# Patient Record
Sex: Female | Born: 1995
Health system: Southern US, Community
[De-identification: ages and names within clinical notes are randomized; demographics above are authoritative.]

## PROBLEM LIST (undated history)

## (undated) ENCOUNTER — Ambulatory Visit: Admission: EM | Payer: Self-pay

## (undated) ENCOUNTER — Ambulatory Visit: Admission: EM | Payer: Self-pay | Source: Home / Self Care

## (undated) DIAGNOSIS — N644 Mastodynia: Secondary | ICD-10-CM

## (undated) DIAGNOSIS — T7840XA Allergy, unspecified, initial encounter: Secondary | ICD-10-CM

## (undated) DIAGNOSIS — R102 Pelvic and perineal pain unspecified side: Secondary | ICD-10-CM

## (undated) DIAGNOSIS — F329 Major depressive disorder, single episode, unspecified: Secondary | ICD-10-CM

## (undated) DIAGNOSIS — F32A Depression, unspecified: Secondary | ICD-10-CM

## (undated) DIAGNOSIS — K529 Noninfective gastroenteritis and colitis, unspecified: Secondary | ICD-10-CM

## (undated) DIAGNOSIS — F419 Anxiety disorder, unspecified: Secondary | ICD-10-CM

## (undated) DIAGNOSIS — N926 Irregular menstruation, unspecified: Secondary | ICD-10-CM

## (undated) HISTORY — DX: Irregular menstruation, unspecified: N92.6

## (undated) HISTORY — DX: Mastodynia: N64.4

## (undated) HISTORY — DX: Allergy, unspecified, initial encounter: T78.40XA

## (undated) HISTORY — DX: Anxiety disorder, unspecified: F41.9

## (undated) HISTORY — DX: Major depressive disorder, single episode, unspecified: F32.9

## (undated) HISTORY — DX: Noninfective gastroenteritis and colitis, unspecified: K52.9

## (undated) HISTORY — DX: Depression, unspecified: F32.A

## (undated) HISTORY — DX: Pelvic and perineal pain: R10.2

## (undated) HISTORY — DX: Pelvic and perineal pain unspecified side: R10.20

---
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
## 2018-01-11 DIAGNOSIS — R1032 Left lower quadrant pain: Secondary | ICD-10-CM | POA: Diagnosis not present

## 2018-01-11 DIAGNOSIS — N390 Urinary tract infection, site not specified: Secondary | ICD-10-CM | POA: Diagnosis not present

## 2018-01-21 ENCOUNTER — Other Ambulatory Visit: Payer: Self-pay

## 2018-01-21 ENCOUNTER — Emergency Department
Admission: EM | Admit: 2018-01-21 | Discharge: 2018-01-21 | Disposition: A | Discharge status: Home / Self Care | Payer: BLUE CROSS/BLUE SHIELD | Attending: Emergency Medicine | Admitting: Emergency Medicine

## 2018-01-21 ENCOUNTER — Encounter: Payer: Self-pay | Admitting: Emergency Medicine

## 2018-01-21 DIAGNOSIS — R52 Pain, unspecified: Secondary | ICD-10-CM

## 2018-01-21 DIAGNOSIS — N644 Mastodynia: Secondary | ICD-10-CM

## 2018-01-21 LAB — POCT PREGNANCY, URINE: PREG TEST UR: NEGATIVE

## 2018-01-21 NOTE — ED Triage Notes (Signed)
Pt to ED via POV c/o sharp pain in right breast over the past few months. Pt states that the past week she has felt like the pain has started to radiate into her right arm, her back, and her left chest. Pt denies shortness of breath, N/V. Pt states that the pain feels like a burning sensation, pt states that pressing on the nipple causes the burning sensation. Pt is in NAD at this time.

## 2018-01-21 NOTE — ED Notes (Signed)
ED Provider at bedside. 

## 2018-01-21 NOTE — ED Provider Notes (Signed)
[facility] Emergency Department Provider Note  Time seen: 7:01 PM  I have reviewed the triage vital signs and the nursing notes.   HISTORY  Chief Complaint Breast Pain    HPI Conchetta Lamia is a 22 y.o. female with no past medical history presents to the emergency department for right breast pain.  According to the patient for the past several weeks she has been expensing intermittent pain in the right breast.  She says for the past 1 week the pain is been much more constant.  States it is more burning in the right breast especially if she presses on the nipple.  Denies any pain with deep inspiration.  Denies any pain deeper in the chest.  Denies any leg pain or swelling.  Denies any cough.  Last period was approximately 2 weeks ago.  Has never been pregnant.   History reviewed. No pertinent past medical history.  There are no active problems to display for this patient.   History reviewed. No pertinent surgical history.  Prior to Admission medications   Not on File    Allergies  Allergen Reactions  . Amoxicillin   . Bactrim [Sulfamethoxazole-Trimethoprim]   . Cephalosporins   . Penicillins     No family history on file.  Social History Social History   Tobacco Use  . Smoking status: Never Smoker  . Smokeless tobacco: Never Used  Substance Use Topics  . Alcohol use: Not Currently  . Drug use: Not Currently    Review of Systems Constitutional: Negative for fever. Eyes: Negative for visual complaints ENT: Negative for recent illness/congestion Cardiovascular: Right breast pain.  Denies any pain deeper in the chest. Respiratory: Negative for shortness of breath.  Negative for cough. Gastrointestinal: Negative for abdominal pain, vomiting  Genitourinary: Negative for dysuria but states she was recently treated for urinary tract infection. Musculoskeletal: Negative for musculoskeletal complaints Skin: Negative for skin complaints   Neurological: Negative for headache All other ROS negative  ____________________________________________   PHYSICAL EXAM:  VITAL SIGNS: ED Triage Vitals [01/21/18 1709]  Enc Vitals Group     BP (!) 148/83     Pulse Rate 86     Resp 16     Temp 98.2 F (36.8 C)     Temp Source Oral     SpO2 97 %     Weight 164 lb (74.4 kg)     Height 5\' 4"  (1.626 m)     Head Circumference      Peak Flow      Pain Score 8     Pain Loc      Pain Edu?      Excl. in [place]?    Constitutional: Alert and oriented. Well appearing and in no distress. Eyes: Normal exam ENT   Head: Normocephalic and atraumatic.   Mouth/Throat: Mucous membranes are moist. Cardiovascular: Normal rate, regular rhythm. No murmur.   Respiratory: Normal respiratory effort without tachypnea nor retractions. Breath sounds are clear.   Gastrointestinal: Soft and nontender. No distention. Musculoskeletal: Nontender with normal range of motion in all extremities.  Mild right breast tenderness to palpation especially in the left lower quadrant of the breast.  No mass.  Normal axilla without mass or lymphadenopathy. Neurologic:  Normal speech and language. No gross focal neurologic deficits Skin:  Skin is warm, dry and intact.  Psychiatric: Mood and affect are normal.    EKG Reviewed and interpreted by myself shows normal sinus rhythm 83 bpm with a narrow  QRS, normal axis, normal intervals, no ST changes.  Normal EKG.  ____________________________________________   INITIAL IMPRESSION / ASSESSMENT AND PLAN / ED COURSE  Pertinent labs & imaging results that were available during my care of the patient were reviewed by me and considered in my medical decision making (see chart for details).  Patient presents emergency department for right breast pain constant for the past 1 week, worse if she presses on the nipple.  Describes the pain is more as a sharp shooting type pain anytime she touches her right breast.  Denies  any lumps or bumps.  On exam patient did have tenderness to palpation especially in the left lower quadrant of the right breast, no masses identified in the axilla or breast.  No discharge identified.  Examination performed with the nurse in the room.  Pregnancy test is negative.  Attempted to obtain a breast ultrasound however after speaking to radiology they prefer to have the patient follow-up in the breast clinic.  I believe this is appropriate as this does not appear to be an emergent test.  Patient states she has insurance and will follow-up in the breast center.  ____________________________________________   FINAL CLINICAL IMPRESSION(S) / ED DIAGNOSES  Right breast pain    Harvest Dark, MD 01/21/18 1940

## 2018-01-21 NOTE — Discharge Instructions (Signed)
Please call the number provided below to arrange a follow-up appointment with the breast clinic for further evaluation and possible ultrasound of your right breast.  Please call your primary care doctor on Tuesday to inform them of today's ER visit and to arrange a follow-up appointment.    [facility]  Address: [address] [place], [place], [place] [address] Phone: [phone]

## 2018-01-21 NOTE — ED Triage Notes (Signed)
First Nurse Note:  Arrives with c/o right breast pain intermittently x 2 months, worse over the past 10 days.  States pain radiates toward left breast.  No SOB/ DOE.  Skin warm and dry.  NAD

## 2018-02-01 DIAGNOSIS — R1030 Lower abdominal pain, unspecified: Secondary | ICD-10-CM | POA: Diagnosis not present

## 2018-02-02 DIAGNOSIS — R1030 Lower abdominal pain, unspecified: Secondary | ICD-10-CM | POA: Diagnosis not present

## 2018-02-11 DIAGNOSIS — R197 Diarrhea, unspecified: Secondary | ICD-10-CM | POA: Diagnosis not present

## 2018-02-11 DIAGNOSIS — R319 Hematuria, unspecified: Secondary | ICD-10-CM | POA: Diagnosis not present

## 2018-02-11 DIAGNOSIS — K529 Noninfective gastroenteritis and colitis, unspecified: Secondary | ICD-10-CM | POA: Diagnosis not present

## 2018-02-11 DIAGNOSIS — M549 Dorsalgia, unspecified: Secondary | ICD-10-CM | POA: Diagnosis not present

## 2018-02-11 DIAGNOSIS — R1032 Left lower quadrant pain: Secondary | ICD-10-CM | POA: Diagnosis not present

## 2018-02-15 ENCOUNTER — Encounter: Payer: Self-pay | Admitting: Obstetrics and Gynecology

## 2018-02-15 ENCOUNTER — Ambulatory Visit (INDEPENDENT_AMBULATORY_CARE_PROVIDER_SITE_OTHER): Payer: BLUE CROSS/BLUE SHIELD | Admitting: Obstetrics and Gynecology

## 2018-02-15 VITALS — BP 133/83 | HR 87 | Ht 64.0 in | Wt 163.7 lb

## 2018-02-15 DIAGNOSIS — N644 Mastodynia: Secondary | ICD-10-CM

## 2018-02-15 DIAGNOSIS — R102 Pelvic and perineal pain: Secondary | ICD-10-CM

## 2018-02-15 NOTE — Progress Notes (Signed)
HPI:      [name] is a 22 y.o. G0P0000 who LMP was Patient's last menstrual period was 02/03/2018 (exact date).  Subjective:   She presents today with complaint of a 44-month history of right breast pain.  She states that it is a sharp stabbing pain when it occurs.  Simply touching the nipple causes the pain occasionally.  She reports a breast heaviness/fullness that she is concerned about as well.  She has been seen in the emergency department for this pain. She also complains of intermittent left lower quadrant pain which has been present on and off for several months.  She is not having that pain today. Patient describes normal menstrual cycles approximately 10/year.  Her last 3 cycles have been regular and monthly.  She reports that the nipple pain and the pelvic pain have no relationship to her menstrual cycle. She uses condoms for birth control and does not have pain with intercourse. She reports there is no family history of breast cancer.  She denies nipple discharge.    Hx: The following portions of the patient's history were reviewed and updated as appropriate:             She  has a past medical history of Breast pain, Irregular menstrual cycle, and Pelvic pain. She does not have a problem list on file. She  has a past surgical history that includes No past surgeries. Her family history includes Diabetes in her father. She  reports that she has never smoked. She has never used smokeless tobacco. She reports that she drank alcohol. She reports that she has current or past drug history. She has a current medication list which includes the following prescription(s): hyoscyamine and metronidazole. She is allergic to amoxicillin; bactrim [sulfamethoxazole-trimethoprim]; cephalosporins; and penicillins.       Review of Systems:  Review of Systems  Constitutional: Denied constitutional symptoms, night sweats, recent illness, fatigue, fever, insomnia and weight loss.  Eyes:  Denied eye symptoms, eye pain, photophobia, vision change and visual disturbance.  Ears/Nose/Throat/Neck: Denied ear, nose, throat or neck symptoms, hearing loss, nasal discharge, sinus congestion and sore throat.  Cardiovascular: Denied cardiovascular symptoms, arrhythmia, chest pain/pressure, edema, exercise intolerance, orthopnea and palpitations.  Respiratory: Denied pulmonary symptoms, asthma, pleuritic pain, productive sputum, cough, dyspnea and wheezing.  Gastrointestinal: Denied, gastro-esophageal reflux, melena, nausea and vomiting.  Genitourinary: See HPI for additional information.  Musculoskeletal: Denied musculoskeletal symptoms, stiffness, swelling, muscle weakness and myalgia.  Dermatologic: Denied dermatology symptoms, rash and scar.  Neurologic: Denied neurology symptoms, dizziness, headache, neck pain and syncope.  Psychiatric: Denied psychiatric symptoms, anxiety and depression.  Endocrine: Denied endocrine symptoms including hot flashes and night sweats.   Meds:   Current Outpatient Medications on File Prior to Visit  Medication Sig Dispense Refill  . hyoscyamine (LEVSIN, ANASPAZ) 0.125 MG tablet Take 0.125 mg by mouth every 4 (four) hours as needed.    . metroNIDAZOLE (FLAGYL) 500 MG tablet TK 1 T PO TID  0   No current facility-administered medications on file prior to visit.     Objective:     Vitals:   02/15/18 1020  BP: 133/83  Pulse: 87              Physical examination General NAD, Conversant  Skin No rashes, lesions or ulceration. Normal palpated skin turgor. No nodularity.  Breasts: No masses or discharge.  Symmetric.  No axillary adenopathy.  Abdomen: Soft.  Non-tender.  No masses.  No HSM. No  hernia or pain in the left lower quadrant is in the area where the hernia would occur.  It is more inguinal than abdominal.  The pain could not be elicited today.       Assessment:    G0P0000 There are no active problems to display for this patient.     1. Nipple pain   2. Breast pain, right   3. Pelvic pain in female     No breast masses no evidence of any breast abnormality.  The pain seems to be superficial to touch.  Pelvic pain likely related to small hernia or muscular strain.  Location is not pelvic.   Plan:            1.  Ultrasound right breast to rule out abnormality  2.  Reassurance given regarding left lower quadrant pain.  If pain becomes worse consider pelvic ultrasound and exam.  3.  Prolactin because of her fullness feeling and breast symptoms. Orders Orders Placed This Encounter  Procedures  . MM Digital Diagnostic Bilat  . MM DIAG BREAST TOMO UNI RIGHT  . US BREAST LTD UNI LEFT INC AXILLA  . US BREAST LTD UNI RIGHT INC AXILLA    No orders of the defined types were placed in this encounter.     F/U  Return for We will contact her with any abnormal test results. I spent 31 minutes involved in the care of this patient of which greater than 50% was spent discussing her symptoms of breast pain and the work-up as well as possible diagnoses, her pelvic pain, the meaning of an inguinal hernia, menstrual cycle issues etc.  Finis Bud, M.D. 02/15/2018 11:01 AM

## 2018-02-16 DIAGNOSIS — K529 Noninfective gastroenteritis and colitis, unspecified: Secondary | ICD-10-CM | POA: Diagnosis not present

## 2018-02-16 DIAGNOSIS — R197 Diarrhea, unspecified: Secondary | ICD-10-CM | POA: Diagnosis not present

## 2018-02-16 DIAGNOSIS — R1032 Left lower quadrant pain: Secondary | ICD-10-CM | POA: Diagnosis not present

## 2018-02-16 DIAGNOSIS — M549 Dorsalgia, unspecified: Secondary | ICD-10-CM | POA: Diagnosis not present

## 2018-02-21 ENCOUNTER — Other Ambulatory Visit: Payer: Self-pay | Admitting: Obstetrics and Gynecology

## 2018-02-21 ENCOUNTER — Other Ambulatory Visit: Payer: BLUE CROSS/BLUE SHIELD

## 2018-02-21 ENCOUNTER — Ambulatory Visit
Admission: RE | Admit: 2018-02-21 | Discharge: 2018-02-21 | Disposition: A | Discharge status: Home / Self Care | Payer: BLUE CROSS/BLUE SHIELD | Source: Ambulatory Visit | Attending: Obstetrics and Gynecology | Admitting: Obstetrics and Gynecology

## 2018-02-21 DIAGNOSIS — N644 Mastodynia: Secondary | ICD-10-CM

## 2018-02-21 DIAGNOSIS — R928 Other abnormal and inconclusive findings on diagnostic imaging of breast: Secondary | ICD-10-CM

## 2018-02-21 DIAGNOSIS — R922 Inconclusive mammogram: Secondary | ICD-10-CM | POA: Diagnosis not present

## 2018-02-21 DIAGNOSIS — R921 Mammographic calcification found on diagnostic imaging of breast: Secondary | ICD-10-CM

## 2018-02-22 LAB — PROLACTIN: Prolactin: 23.6 ng/mL — ABNORMAL HIGH (ref 4.8–23.3)

## 2018-02-24 ENCOUNTER — Other Ambulatory Visit: Payer: Self-pay

## 2018-02-24 DIAGNOSIS — E229 Hyperfunction of pituitary gland, unspecified: Principal | ICD-10-CM

## 2018-02-24 DIAGNOSIS — R7989 Other specified abnormal findings of blood chemistry: Secondary | ICD-10-CM

## 2018-03-02 DIAGNOSIS — R1032 Left lower quadrant pain: Secondary | ICD-10-CM | POA: Diagnosis not present

## 2018-03-02 DIAGNOSIS — K529 Noninfective gastroenteritis and colitis, unspecified: Secondary | ICD-10-CM | POA: Diagnosis not present

## 2018-03-02 DIAGNOSIS — R197 Diarrhea, unspecified: Secondary | ICD-10-CM | POA: Diagnosis not present

## 2018-03-02 DIAGNOSIS — R933 Abnormal findings on diagnostic imaging of other parts of digestive tract: Secondary | ICD-10-CM | POA: Diagnosis not present

## 2018-03-02 DIAGNOSIS — K5730 Diverticulosis of large intestine without perforation or abscess without bleeding: Secondary | ICD-10-CM | POA: Diagnosis not present

## 2018-03-02 HISTORY — PX: COLONOSCOPY: SHX174

## 2018-03-02 LAB — HM COLONOSCOPY

## 2018-03-08 ENCOUNTER — Ambulatory Visit
Admission: RE | Admit: 2018-03-08 | Discharge: 2018-03-08 | Disposition: A | Discharge status: Home / Self Care | Payer: BLUE CROSS/BLUE SHIELD | Source: Ambulatory Visit | Attending: Obstetrics and Gynecology | Admitting: Obstetrics and Gynecology

## 2018-03-08 DIAGNOSIS — R921 Mammographic calcification found on diagnostic imaging of breast: Secondary | ICD-10-CM | POA: Diagnosis not present

## 2018-03-08 DIAGNOSIS — R928 Other abnormal and inconclusive findings on diagnostic imaging of breast: Secondary | ICD-10-CM

## 2018-03-08 DIAGNOSIS — D241 Benign neoplasm of right breast: Secondary | ICD-10-CM | POA: Diagnosis not present

## 2018-03-08 DIAGNOSIS — R920 Mammographic microcalcification found on diagnostic imaging of breast: Secondary | ICD-10-CM | POA: Diagnosis not present

## 2018-03-08 HISTORY — PX: BREAST BIOPSY: SHX20

## 2018-03-10 ENCOUNTER — Other Ambulatory Visit: Payer: BLUE CROSS/BLUE SHIELD

## 2018-03-10 DIAGNOSIS — E229 Hyperfunction of pituitary gland, unspecified: Secondary | ICD-10-CM | POA: Diagnosis not present

## 2018-03-10 DIAGNOSIS — R7989 Other specified abnormal findings of blood chemistry: Secondary | ICD-10-CM

## 2018-03-10 LAB — SURGICAL PATHOLOGY

## 2018-03-11 LAB — PROLACTIN: Prolactin: 11.2 ng/mL (ref 4.8–23.3)

## 2018-03-15 ENCOUNTER — Telehealth: Payer: Self-pay

## 2018-03-15 NOTE — Telephone Encounter (Signed)
Per TH- Pt's breast bx showed papilloma. Pt is aware. She has an appt with Dr. Terri Piedra on 04/12/2018 at 10:45.

## 2018-03-16 ENCOUNTER — Ambulatory Visit (INDEPENDENT_AMBULATORY_CARE_PROVIDER_SITE_OTHER): Payer: BLUE CROSS/BLUE SHIELD | Admitting: Family Medicine

## 2018-03-16 ENCOUNTER — Telehealth: Payer: Self-pay

## 2018-03-16 ENCOUNTER — Encounter: Payer: Self-pay | Admitting: Family Medicine

## 2018-03-16 VITALS — BP 116/82 | HR 69 | Temp 98.1°F | Resp 16 | Ht 64.0 in | Wt 165.0 lb

## 2018-03-16 DIAGNOSIS — F32A Depression, unspecified: Secondary | ICD-10-CM

## 2018-03-16 DIAGNOSIS — Z8719 Personal history of other diseases of the digestive system: Secondary | ICD-10-CM | POA: Insufficient documentation

## 2018-03-16 DIAGNOSIS — Z124 Encounter for screening for malignant neoplasm of cervix: Secondary | ICD-10-CM

## 2018-03-16 DIAGNOSIS — F329 Major depressive disorder, single episode, unspecified: Secondary | ICD-10-CM

## 2018-03-16 DIAGNOSIS — R319 Hematuria, unspecified: Secondary | ICD-10-CM | POA: Diagnosis not present

## 2018-03-16 DIAGNOSIS — N898 Other specified noninflammatory disorders of vagina: Secondary | ICD-10-CM | POA: Insufficient documentation

## 2018-03-16 DIAGNOSIS — F419 Anxiety disorder, unspecified: Secondary | ICD-10-CM | POA: Diagnosis not present

## 2018-03-16 DIAGNOSIS — F411 Generalized anxiety disorder: Secondary | ICD-10-CM | POA: Insufficient documentation

## 2018-03-16 DIAGNOSIS — D241 Benign neoplasm of right breast: Secondary | ICD-10-CM | POA: Insufficient documentation

## 2018-03-16 LAB — POCT URINALYSIS DIPSTICK
Bilirubin, UA: NEGATIVE
GLUCOSE UA: NEGATIVE
Ketones, UA: NEGATIVE
LEUKOCYTES UA: NEGATIVE
NITRITE UA: NEGATIVE
Odor: NORMAL
PROTEIN UA: NEGATIVE
SPEC GRAV UA: 1.025 (ref 1.010–1.025)
Urobilinogen, UA: 0.2 E.U./dL
pH, UA: 6 (ref 5.0–8.0)

## 2018-03-16 MED ORDER — SERTRALINE HCL 25 MG PO TABS: ORAL_TABLET | ORAL | 1 refills | Status: DC

## 2018-03-16 NOTE — Assessment & Plan Note (Signed)
Persistent hematuria on UA today Patient is asymptomatic We will send microscopy and culture Recommend referral to urology given that this is persisted for further evaluation and management

## 2018-03-16 NOTE — Patient Instructions (Addendum)
[email or web address]  Zoloft 25mg  pills: Start with 1/2 pill daily x2 weeks Then 1 pills daily for 2 weeks Then 2 pills daily for 2 weeks Then 3 pills daily for 2 weeks  Can go more slowly if side effects are bad    Major Depressive Disorder, Adult Major depressive disorder (MDD) is a mental health condition. MDD often makes you feel sad, hopeless, or helpless. MDD can also cause symptoms in your body. MDD can affect your:  Work.  School.  Relationships.  Other normal activities.  MDD can range from mild to very bad. It may occur once (single episode MDD). It can also occur many times (recurrent MDD). The main symptoms of MDD often include:  Feeling sad, depressed, or irritable most of the time.  Loss of interest.  MDD symptoms also include:  Sleeping too much or too little.  Eating too much or too little.  A change in your weight.  Feeling tired (fatigue) or having low energy.  Feeling worthless.  Feeling guilty.  Trouble making decisions.  Trouble thinking clearly.  Thoughts of suicide or harming others.  Feeling weak.  Feeling agitated.  Keeping yourself from being around other people (isolation).  Follow these instructions at home: Activity  Do these things as told by your doctor: ? Go back to your normal activities. ? Exercise regularly. ? Spend time outdoors. Alcohol  Talk with your doctor about how alcohol can affect your antidepressant medicines.  Do not drink alcohol. Or, limit how much alcohol you drink. ? This means no more than 1 drink a day for nonpregnant women and 2 drinks a day for men. One drink equals one of these:  12 oz of beer.  5 oz of wine.  1 oz of hard liquor. General instructions  Take over-the-counter and prescription medicines only as told by your doctor.  Eat a healthy diet.  Get plenty of sleep.  Find activities that you enjoy. Make time to do them.  Think about joining a support group. Your doctor may  be able to suggest a group for you.  Keep all follow-up visits as told by your doctor. This is important. Where to find more information:  [employer] on Mental Illness: ? www.nami.[place]: ? https://carter.com/  National Suicide Prevention Lifeline: ? (463)344-4890. This is free, 24-hour help. Contact a doctor if:  Your symptoms get worse.  You have new symptoms. Get help right away if:  You self-harm.  You see, hear, taste, smell, or feel things that are not present (hallucinate). If you ever feel like you may hurt yourself or others, or have thoughts about taking your own life, get help right away. You can go to your nearest emergency department or call:  Your local emergency services (911 in the [place]  A suicide crisis helpline, such as the National Suicide Prevention Lifeline: ? 684-703-2246. This is open 24 hours a day.  This information is not intended to replace advice given to you by your health care provider. Make sure you discuss any questions you have with your health care provider. Document Released: 07/29/2015 Document Revised: 05/03/2016 Document Reviewed: 05/03/2016 Elsevier Interactive Patient Education  2017 [employer].

## 2018-03-16 NOTE — Assessment & Plan Note (Signed)
Patient has follow-up with surgery next month Her breast does appear bruised today and the papilloma is palpable, but it does appear to be healing well Encouraged ongoing icing and Tylenol use As she is over a week out from her biopsy, it is okay to use NSAIDs intermittently as well

## 2018-03-16 NOTE — Assessment & Plan Note (Signed)
New problem ongoing for a few months Check BV and yeast, as well as trichomonas, GC, chlamydia on her Pap We will treat as indicated by the results

## 2018-03-16 NOTE — Assessment & Plan Note (Addendum)
Patient with long-standing history of depression and anxiety that is currently uncontrolled As she believes she is tried Celexa in the past, we will not try this again Did discuss different SSRIs Agreed to start Zoloft at low dose with slow taper up Discussed that it can take 6 to 8 weeks to reach full efficacy Discussed possible side effects including GI upset, sexual dysfunction, increased SI Contracted for safety as patient only has rare passive SI with no plan Advised on the synergistic effect of medications with therapy Patient to look into finding a therapist that is covered by her insurance Follow-up in about 2 months and consider further increase of Zoloft dose Return precautions discussed Check CMP, CBC, TSH for any secondary causes of depression anxiety

## 2018-03-16 NOTE — Telephone Encounter (Signed)
Left message advising pt. OK per DPR. 

## 2018-03-16 NOTE — Assessment & Plan Note (Signed)
Patient with a few months of left lower quadrant pain with radiation to her back that was treated as colitis after finding on CT scan Pain is now resolved Recent colonoscopy normal per report We will request records from GI Also wonder if this could have represented nephrolithiasis given her hematuria We will continue to monitor for recurrence of symptoms

## 2018-03-16 NOTE — Telephone Encounter (Signed)
-----   Message from Virginia Crews, MD sent at 03/16/2018  1:19 PM EDT ----- Urinalysis does show moderate amount of blood.  Otherwise normal.  I will send urine for culture to ensure there is no infection causing this, as well as microscopy to ensure that the blood is real and not due to dipstick error.  As she has had persistent blood in her urine, I recommend a referral to a urologist.  I have placed this referral.  Virginia Crews, MD, MPH [facility] 03/16/2018 1:19 PM

## 2018-03-16 NOTE — Progress Notes (Signed)
Patient: Katrina Bryant, Female    DOB: 05/18/1996, 22 y.o.   MRN: [id number] Visit Date: 03/16/2018  Today's Provider: Lavon Paganini, MD   I, Martha Clan, CMA, am acting as scribe for Lavon Paganini, MD.  Chief Complaint  Patient presents with  . New Patient (Initial Visit)   Subjective:    New Patient Katrina Bryant is a 22 y.o. female who presents today as a new pt to establish care. She feels fairly well. She reports exercising none, as of recent. She reports she is sleeping poorly.  She was previously seen at [facility] a few years ago.  She has been without a physician for a few years.  Pt is c/o anxiety and depression. She has had recent health scares, including a breast biopsy (Dr Theda Sers at [place]) which was performed on 03/08/2018 (this is still causing pain, and pt would like PCP to examine this). She recently started a new job, which could be contributing to the anxiety.  She states she has suffered with depression and anxiety for a long time.  She was diagnosed when she was in high school.  She was previously seeing a therapist which was going well.  She tried medications in the past as well.  She cannot remember exactly which medication she tried, may be Celexa, but it did cause nausea.  She has been off treatment for few years since she lost her health insurance.  She does report passive SI that occurs about once a month.  She has no plan.  She states that her family is a good support system.  She is interested in trying a medication and resuming therapy.  Her breast biopsy revealed a right breast papilloma.  She has an appointment with Dr. Tollie Pizza next month for follow-up of this.  Patient has had issues for a few months with left lower quadrant pain that also radiated to her back.  She was seen in several different urgent cares including the [facility].  She tried a few courses of antibiotics.  She then was seen to have colitis on a CT scan and  put on Cipro and Flagyl.  She saw GI in [place] and her pain was somewhat better but not gone.  She underwent colonoscopy on 03/02/2018 and was told that everything seemed normal and they thought it was an infection that had cleared with the antibiotics.  During this work-up for her colitis, it was noted on several UAs that she had blood in her urine.  She states that she was not evaluated for possible nephrolithiasis or other causes of hematuria.  She is worried that her pain will come back even though she has no pain currently.  She also reports vaginal itching and discharge for a few months.  The discharge is white and thick.  She is not currently sexually active but has been in the past.  She has never had a Pap smear. -----------------------------------------------------------------   Review of Systems  Constitutional: Positive for fatigue. Negative for activity change, appetite change, chills, diaphoresis, fever and unexpected weight change.  HENT: Negative.   Eyes: Negative.   Respiratory: Negative.   Cardiovascular: Negative.   Gastrointestinal: Negative.   Endocrine: Negative.   Genitourinary: Negative.   Musculoskeletal: Negative.   Skin: Negative.   Allergic/Immunologic: Negative.   Neurological: Negative.   Hematological: Negative.   Psychiatric/Behavioral: Positive for agitation. Negative for behavioral problems, confusion, decreased concentration, dysphoric mood, hallucinations, self-injury, sleep disturbance and suicidal ideas. The  patient is not nervous/anxious and is not hyperactive.     Social History      She  reports that she has never smoked. She has never used smokeless tobacco. She reports that she does not drink alcohol or use drugs.       Social History   Socioeconomic History  . Marital status: Single    Spouse name: Not on file  . Number of children: 0  . Years of education: 54  . Highest education level: Associate degree: academic program  Occupational  History    Employer: Herbalist  Social Needs  . Financial resource strain: Not on file  . Food insecurity:    Worry: Not on file    Inability: Not on file  . Transportation needs:    Medical: Not on file    Non-medical: Not on file  Tobacco Use  . Smoking status: Never Smoker  . Smokeless tobacco: Never Used  Substance and Sexual Activity  . Alcohol use: Never    Frequency: Never  . Drug use: Never  . Sexual activity: Not Currently    Partners: Male    Birth control/protection: Condom    Comment: last sexually active February 2019 (03/16/18)  Lifestyle  . Physical activity:    Days per week: Not on file    Minutes per session: Not on file  . Stress: Not on file  Relationships  . Social connections:    Talks on phone: Not on file    Gets together: Not on file    Attends religious service: Not on file    Active member of club or organization: Not on file    Attends meetings of clubs or organizations: Not on file    Relationship status: Not on file  Other Topics Concern  . Not on file  Social History Narrative  . Not on file    Past Medical History:  Diagnosis Date  . Allergy   . Anxiety   . Breast pain   . Colitis   . Depression   . Irregular menstrual cycle   . Pelvic pain      Patient Active Problem List   Diagnosis Date Noted  . Hematuria 03/16/2018  . Vaginal discharge 03/16/2018  . Anxiety and depression 03/16/2018  . History of colitis 03/16/2018  . Papilloma of right breast 03/16/2018    Past Surgical History:  Procedure Laterality Date  . BREAST BIOPSY Right 03/08/2018   Affirm Bx- path pending- X clip  . COLONOSCOPY  03/02/2018    Family History        Family Status  Relation Name Status  . Father  Alive  . Mother  Alive  . Sister  Alive  . Brother  Alive  . Neg Hx  (Not Specified)        Her family history includes Diabetes in her father; Healthy in her brother and sister; Hyperlipidemia in her father; Hypertension  in her father and mother. There is no history of Breast cancer, Ovarian cancer, Colon cancer, or Cervical cancer.      Allergies  Allergen Reactions  . Cephalosporins   . Amoxicillin Rash  . Bactrim [Sulfamethoxazole-Trimethoprim] Rash  . Biaxin [Clarithromycin] Rash  . Clindamycin/Lincomycin Rash  . Penicillins Rash     Current Outpatient Medications:  .  sertraline (ZOLOFT) 25 MG tablet, Take 1/2 tab PO daily x2wks, then 25mg  PO daily x2 wks, then 50mg  daily x2 wks, then 75mg  daily x2 wks, Disp: 90 tablet, Rfl:  1   Patient Care Team: Virginia Crews, MD as PCP - General (Family Medicine)      Objective:   Vitals: BP 116/82 (BP Location: Left Arm, Patient Position: Sitting, Cuff Size: Normal)   Pulse 69   Temp 98.1 F (36.7 C) (Oral)   Resp 16   Ht 5\' 4"  (1.626 m)   Wt 165 lb (74.8 kg)   LMP 03/04/2018   SpO2 96%   BMI 28.32 kg/m    Vitals:   03/16/18 0916  BP: 116/82  Pulse: 69  Resp: 16  Temp: 98.1 F (36.7 C)  TempSrc: Oral  SpO2: 96%  Weight: 165 lb (74.8 kg)  Height: 5\' 4"  (1.626 m)     Physical Exam  Constitutional: She is oriented to person, place, and time. She appears well-developed and well-nourished. No distress.  HENT:  Head: Normocephalic and atraumatic.  Right Ear: External ear normal.  Left Ear: External ear normal.  Nose: Nose normal.  Mouth/Throat: Oropharynx is clear and moist.  Eyes: Pupils are equal, round, and reactive to light. Conjunctivae and EOM are normal. No scleral icterus.  Neck: Neck supple. No thyromegaly present.  Cardiovascular: Normal rate, regular rhythm, normal heart sounds and intact distal pulses.  No murmur heard. Pulmonary/Chest: Effort normal and breath sounds normal. No respiratory distress. She has no wheezes. She has no rales.  Abdominal: Soft. Bowel sounds are normal. She exhibits no distension. There is no tenderness. There is no rebound and no guarding.  Genitourinary:  Genitourinary Comments: GYN:   External genitalia within normal limits.  Vaginal mucosa pink, moist, normal rugae.  Nonfriable cervix without lesions, + discharge, no bleeding noted on speculum exam.  No cervical motion tenderness. Breasts: right breast with papilloma palpable and bruising superficially, left breast normal without mass, skin or nipple changes or axillary nodes.   Musculoskeletal: She exhibits no edema or deformity.  Lymphadenopathy:    She has no cervical adenopathy.  Neurological: She is alert and oriented to person, place, and time.  Skin: Skin is warm and dry. Capillary refill takes less than 2 seconds. No rash noted.  Psychiatric: She has a normal mood and affect. Her behavior is normal.  Vitals reviewed.     Depression screen PHQ 2/9 03/16/2018  Decreased Interest 2  Down, Depressed, Hopeless 3  PHQ - 2 Score 5  Altered sleeping 2  Tired, decreased energy 3  Change in appetite 3  Feeling bad or failure about yourself  2  Trouble concentrating 1  Moving slowly or fidgety/restless 0  Suicidal thoughts 1  PHQ-9 Score 17  Difficult doing work/chores Very difficult    GAD 7 : Generalized Anxiety Score 03/16/2018  Nervous, Anxious, on Edge 3  Control/stop worrying 3  Worry too much - different things 3  Trouble relaxing 2  Restless 1  Easily annoyed or irritable 3  Afraid - awful might happen 3  Total GAD 7 Score 18  Anxiety Difficulty Extremely difficult      Results for orders placed or performed in visit on 03/16/18  POCT urinalysis dipstick  Result Value Ref Range   Color, UA dark yellow    Clarity, UA clear    Glucose, UA Negative Negative   Bilirubin, UA negative    Ketones, UA negative    Spec Grav, UA 1.025 1.010 - 1.025   Blood, UA moderate    pH, UA 6.0 5.0 - 8.0   Protein, UA Negative Negative   Urobilinogen, UA 0.2 0.2 or  1.0 E.U./dL   Nitrite, UA negative    Leukocytes, UA Negative Negative   Appearance dark    Odor normal      Assessment & Plan:    Problem  List Items Addressed This Visit      Other   Hematuria    Persistent hematuria on UA today Patient is asymptomatic We will send microscopy and culture Recommend referral to urology given that this is persisted for further evaluation and management      Relevant Orders   POCT urinalysis dipstick (Completed)   Urinalysis, microscopic only   Urine Culture   Ambulatory referral to Urology   Vaginal discharge    New problem ongoing for a few months Check BV and yeast, as well as trichomonas, GC, chlamydia on her Pap We will treat as indicated by the results      Relevant Orders   NuSwab BV and Candida, NAA   Anxiety and depression - Primary    Patient with long-standing history of depression and anxiety that is currently uncontrolled As she believes she is tried Celexa in the past, we will not try this again Did discuss different SSRIs Agreed to start Zoloft at low dose with slow taper up Discussed that it can take 6 to 8 weeks to reach full efficacy Discussed possible side effects including GI upset, sexual dysfunction, increased SI Contracted for safety as patient only has rare passive SI with no plan Advised on the synergistic effect of medications with therapy Patient to look into finding a therapist that is covered by her insurance Follow-up in about 2 months and consider further increase of Zoloft dose Return precautions discussed Check CMP, CBC, TSH for any secondary causes of depression anxiety      Relevant Medications   sertraline (ZOLOFT) 25 MG tablet   Other Relevant Orders   CBC   Comprehensive metabolic panel   TSH   History of colitis    Patient with a few months of left lower quadrant pain with radiation to her back that was treated as colitis after finding on CT scan Pain is now resolved Recent colonoscopy normal per report We will request records from GI Also wonder if this could have represented nephrolithiasis given her hematuria We will continue to  monitor for recurrence of symptoms      Relevant Orders   Comprehensive metabolic panel   Papilloma of right breast    Patient has follow-up with surgery next month Her breast does appear bruised today and the papilloma is palpable, but it does appear to be healing well Encouraged ongoing icing and Tylenol use As she is over a week out from her biopsy, it is okay to use NSAIDs intermittently as well       Other Visit Diagnoses    Cervical cancer screening       Relevant Orders   Pap IG, CT/NG w/ reflex HPV when ASC-U       Return in about 2 months (around 05/17/2018) for depression/anxiety f/u.  ROI sent to GI and previous PCP  Plan to update vaccinations at next visit  The entirety of the information documented in the History of Present Illness, Review of Systems and Physical Exam were personally obtained by me. Portions of this information were initially documented by Raquel Sarna Ratchford, CMA and reviewed by me for thoroughness and accuracy.    Virginia Crews, MD, MPH [facility] 03/16/2018 1:31 PM

## 2018-03-17 ENCOUNTER — Telehealth: Payer: Self-pay

## 2018-03-17 ENCOUNTER — Encounter: Payer: Self-pay | Admitting: General Surgery

## 2018-03-17 ENCOUNTER — Telehealth: Payer: Self-pay | Admitting: General Surgery

## 2018-03-17 DIAGNOSIS — R748 Abnormal levels of other serum enzymes: Secondary | ICD-10-CM

## 2018-03-17 LAB — COMPREHENSIVE METABOLIC PANEL
A/G RATIO: 2 (ref 1.2–2.2)
ALT: 80 IU/L — AB (ref 0–32)
AST: 43 IU/L — AB (ref 0–40)
Albumin: 4.9 g/dL (ref 3.5–5.5)
Alkaline Phosphatase: 77 IU/L (ref 39–117)
BUN/Creatinine Ratio: 18 (ref 9–23)
BUN: 9 mg/dL (ref 6–20)
Bilirubin Total: 0.6 mg/dL (ref 0.0–1.2)
CO2: 20 mmol/L (ref 20–29)
Calcium: 9.5 mg/dL (ref 8.7–10.2)
Chloride: 103 mmol/L (ref 96–106)
Creatinine, Ser: 0.51 mg/dL — ABNORMAL LOW (ref 0.57–1.00)
GFR calc Af Amer: 159 mL/min/{1.73_m2} (ref >59)
GFR, EST NON AFRICAN AMERICAN: 138 mL/min/{1.73_m2} (ref >59)
GLUCOSE: 88 mg/dL (ref 65–99)
Globulin, Total: 2.5 g/dL (ref 1.5–4.5)
Potassium: 3.8 mmol/L (ref 3.5–5.2)
Sodium: 140 mmol/L (ref 134–144)
Total Protein: 7.4 g/dL (ref 6.0–8.5)

## 2018-03-17 LAB — CBC
Hematocrit: 37.9 % (ref 34.0–46.6)
Hemoglobin: 12.8 g/dL (ref 11.1–15.9)
MCH: 30.3 pg (ref 26.6–33.0)
MCHC: 33.8 g/dL (ref 31.5–35.7)
MCV: 90 fL (ref 79–97)
PLATELETS: 331 10*3/uL (ref 150–450)
RBC: 4.23 x10E6/uL (ref 3.77–5.28)
RDW: 13.1 % (ref 12.3–15.4)
WBC: 7.1 10*3/uL (ref 3.4–10.8)

## 2018-03-17 LAB — URINALYSIS, MICROSCOPIC ONLY
Bacteria, UA: NONE SEEN
CASTS: NONE SEEN /LPF

## 2018-03-17 LAB — TSH: TSH: 2.25 u[IU]/mL (ref 0.450–4.500)

## 2018-03-17 NOTE — Telephone Encounter (Signed)
Patient has an appointment for 04-12-18 with Dr Bary Castilla. She has in her demographics as needing a interpreter,but Mechele Claude with [employer] states she did not need one with them. Ihave called & l/m to confirm.

## 2018-03-17 NOTE — Telephone Encounter (Signed)
Pt advised and acknowledges understanding. Future labs ordered.

## 2018-03-17 NOTE — Telephone Encounter (Signed)
-----   Message from Virginia Crews, MD sent at 03/17/2018  8:56 AM EDT ----- Normal blood counts, electrolytes, blood sugar, thyroid function, kidney function.  Liver function numbers have elevated slightly.  This is likely related to the Tylenol patient has been taking for her breast biopsy.  She should limit her intake to 2000 mg/day total and substitute with ibuprofen or Aleve.  We can recheck these in a few months to see that they are better.  Virginia Crews, MD, MPH [facility] 03/17/2018 8:56 AM

## 2018-03-17 NOTE — Telephone Encounter (Signed)
-----   Message from Virginia Crews, MD sent at 03/17/2018 10:51 AM EDT ----- There is no blood in the patient's urine on microscopy.  This is much more sensitive than the dipstick urinalysis.  This is a reassuring finding.  We could cancel her urology referral, but I will leave this up to her.  Virginia Crews, MD, MPH [facility] 03/17/2018 10:51 AM

## 2018-03-17 NOTE — Telephone Encounter (Signed)
Left message advising pt. OK per DPR. 

## 2018-03-18 ENCOUNTER — Telehealth: Payer: Self-pay

## 2018-03-18 LAB — URINE CULTURE

## 2018-03-18 NOTE — Telephone Encounter (Signed)
LMTCB 03/18/2018  Thanks,   -Mickel Baas

## 2018-03-18 NOTE — Telephone Encounter (Signed)
Can you cancel URology referral? Thanks!  Virginia Crews, MD, MPH [facility] 03/18/2018 2:21 PM

## 2018-03-18 NOTE — Telephone Encounter (Signed)
-----   Message from Virginia Crews, MD sent at 03/18/2018  9:22 AM EDT ----- No urinary infection  Virginia Crews, MD, MPH [facility] 03/18/2018 9:22 AM

## 2018-03-18 NOTE — Telephone Encounter (Signed)
Patient is calling Katrina Bryant back.

## 2018-03-18 NOTE — Telephone Encounter (Signed)
Pt advised, and would like to cancel urology appointment.

## 2018-03-19 LAB — PAP IG, CT-NG, RFX HPV ASCU
CHLAMYDIA, NUC. ACID AMP: NEGATIVE
GONOCOCCUS BY NUCLEIC ACID AMP: NEGATIVE
PAP Smear Comment: 0

## 2018-03-21 ENCOUNTER — Telehealth: Payer: Self-pay

## 2018-03-21 NOTE — Telephone Encounter (Signed)
-----   Message from Virginia Crews, MD sent at 03/21/2018  8:17 AM EDT ----- Normal pap smear.  Repeat in 3 years.  Negative GC/CT  Bacigalupo, Dionne Bucy, MD, MPH [facility] 03/21/2018 8:17 AM

## 2018-03-21 NOTE — Telephone Encounter (Signed)
Pt advised.

## 2018-03-22 NOTE — Telephone Encounter (Signed)
I left VM message at [place] to cancel appointment

## 2018-03-26 LAB — NUSWAB BV AND CANDIDA, NAA
CANDIDA GLABRATA, NAA: NEGATIVE
Candida albicans, NAA: NEGATIVE

## 2018-03-28 ENCOUNTER — Telehealth: Payer: Self-pay

## 2018-03-28 NOTE — Telephone Encounter (Signed)
-----   Message from Virginia Crews, MD sent at 03/28/2018  8:32 AM EDT ----- Swab negative for yeast, bacterial vaginosis  Brita Romp, Dionne Bucy, MD, MPH [facility] 03/28/2018 8:32 AM

## 2018-03-28 NOTE — Telephone Encounter (Signed)
Left message advising pt. 

## 2018-04-11 ENCOUNTER — Ambulatory Visit: Payer: BLUE CROSS/BLUE SHIELD | Admitting: Urology

## 2018-04-12 ENCOUNTER — Encounter: Payer: Self-pay | Admitting: General Surgery

## 2018-04-12 ENCOUNTER — Ambulatory Visit (INDEPENDENT_AMBULATORY_CARE_PROVIDER_SITE_OTHER): Payer: BLUE CROSS/BLUE SHIELD | Admitting: General Surgery

## 2018-04-12 VITALS — BP 112/70 | HR 72 | Resp 14 | Ht 64.0 in | Wt 167.0 lb

## 2018-04-12 DIAGNOSIS — N644 Mastodynia: Secondary | ICD-10-CM | POA: Diagnosis not present

## 2018-04-12 NOTE — Progress Notes (Signed)
Patient ID: Katrina Bryant, female   DOB: 1996/02/09, 22 y.o.   MRN: [id number]  Chief Complaint  Patient presents with  . Other    papilloma    HPI Katrina Bryant is a 22 y.o. female.  who presents for a breast evaluation referred by Dr Amalia Hailey. The most recent mammogram and right breast biopsy was done on 03-08-18 showing a papilloma and cyst.  She states she had right breast pain and nipple discharge which triggered the ultrasound and mammogram. She states the right breast pain started around February and progressively got worse. She states the pain would radiate to the axilla. She has only seen nipple discharge once since biopsy but the pressure and heaviness remains, worse at night. She admits to occasional sharp shooting pains as well. Denies breast injury or trauma. She states the left breast has had some pain but not as bad a the right. Patient does perform regular self breast checks. Bra is A cup and she wears a supportive bra. Menstrual periods are regular and no pregnancies. She admits to one cup of coffee a day, and cola but not daily. She is here today with her mother, Katrina Bryant. 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  HPI  Past Medical History:  Diagnosis Date  . Allergy   . Anxiety   . Breast pain   . Colitis   . Depression   . Irregular menstrual cycle   . Pelvic pain     Past Surgical History:  Procedure Laterality Date  . BREAST BIOPSY Right 03/08/2018   Affirm Bx-  X clip INTRADUCTAL PAPILLOMA.   Marland Kitchen COLONOSCOPY  03/02/2018    Family History  Problem Relation Age of Onset  . Diabetes Father   . Hyperlipidemia Father   . Hypertension Father   . Hypertension Mother   . Healthy Sister   . Healthy Brother   . Breast cancer Neg Hx   . Ovarian cancer Neg Hx   . Colon cancer Neg Hx   . Cervical cancer Neg Hx     Social History Social History   Tobacco Use  . Smoking status: Never Smoker  . Smokeless tobacco: Never Used  Substance Use  Topics  . Alcohol use: Never    Frequency: Never  . Drug use: Never    Allergies  Allergen Reactions  . Cephalosporins   . Amoxicillin Rash  . Bactrim [Sulfamethoxazole-Trimethoprim] Rash  . Biaxin [Clarithromycin] Rash  . Clindamycin/Lincomycin Rash  . Penicillins Rash    Current Outpatient Medications  Medication Sig Dispense Refill  . sertraline (ZOLOFT) 25 MG tablet Take 1/2 tab PO daily x2wks, then 25mg  PO daily x2 wks, then 50mg  daily x2 wks, then 75mg  daily x2 wks 90 tablet 1   No current facility-administered medications for this visit.     Review of Systems Review of Systems  Constitutional: Negative.   Respiratory: Negative.   Cardiovascular: Negative.     Blood pressure 112/70, pulse 72, resp. rate 14, height 5\' 4"  (1.626 m), weight 167 lb (75.8 kg), last menstrual period 03/05/2018, SpO2 99 %.  Physical Exam Physical Exam  Constitutional: She is oriented to person, place, and time. She appears well-developed and well-nourished.  HENT:  Mouth/Throat: Oropharynx is clear and moist.  Eyes: Conjunctivae are normal. No scleral icterus.  Neck: Neck supple.  Cardiovascular: Normal rate, regular rhythm and normal heart sounds.  Pulmonary/Chest: Effort normal and breath sounds normal. Right breast exhibits no inverted nipple, no mass, no nipple discharge, no  skin change and no tenderness. Left breast exhibits no inverted nipple, no mass, no nipple discharge, no skin change and no tenderness.  small crease right nipple.   Lymphadenopathy:    She has no cervical adenopathy.    She has no axillary adenopathy.  Neurological: She is alert and oriented to person, place, and time.  Skin: Skin is warm and dry.  Psychiatric: Her behavior is normal.    Data Reviewed Prolactin level dated February 21, 2018 with minimally elevated at 23.6 (23.3).  February 21, 2018 bilateral diagnostic mammogram and ultrasound as well as post biopsy images of March 08, 2018 reviewed. DIAGNOSIS:   A. BREAST, RIGHT, LATERAL RETROAREOLAR; STEREOTACTIC-GUIDED CORE BIOPSY:  - INTRADUCTAL PAPILLOMA.  - APOCRINE MICROCYSTS CONTAINING CALCIUM OXALATE CRYSTALS.  - USUAL DUCTAL HYPERPLASIA AND COLUMNAR CELL CHANGE.  - NEGATIVE FOR ATYPIA AND MALIGNANCY.   Colonoscopy dated March 02, 2018 for left lower quadrant abdominal pain, chronic diarrhea and an abnormal CT was unremarkable with the exception of a single diverticulum in the sigmoid colon.  Assessment    Incidental identification of an intraductal papilloma without atypia.  No further treatment required.  Diffuse mastalgia involving the right breast without focal tenderness or discrete mammographic abnormality.    Plan     Patient may take 2 Aleve twice a day for 2 weeks pain or discomfort and call back with a status report after the 2 weeks, sooner if neccessary. The patient is aware to call back for any questions or new concerns.      HPI, Physical Exam, Assessment and Plan have been scribed under the direction and in the presence of Robert Bellow, MD. Karie Fetch, RN  I have completed the exam and reviewed the above documentation for accuracy and completeness.  I agree with the above.  Haematologist has been used and any errors in dictation or transcription are unintentional.  Hervey Ard, M.D., F.A.C.S.  Forest Gleason Lillar Bianca 04/13/2018, 7:03 PM

## 2018-04-12 NOTE — Patient Instructions (Addendum)
The patient is aware to call back for any questions or new concerns.  Patient may take 2 Aleve twice a day for 2 weeks for pain or discomfort and call back with a status report after the 2 weeks, sooner if neccessary.

## 2018-04-13 DIAGNOSIS — N644 Mastodynia: Secondary | ICD-10-CM | POA: Insufficient documentation

## 2018-05-17 ENCOUNTER — Ambulatory Visit: Payer: Self-pay | Admitting: Family Medicine

## 2018-05-24 ENCOUNTER — Ambulatory Visit (INDEPENDENT_AMBULATORY_CARE_PROVIDER_SITE_OTHER): Payer: BLUE CROSS/BLUE SHIELD | Admitting: Family Medicine

## 2018-05-24 ENCOUNTER — Encounter: Payer: Self-pay | Admitting: Family Medicine

## 2018-05-24 VITALS — BP 124/82 | HR 94 | Temp 99.1°F | Wt 171.2 lb

## 2018-05-24 DIAGNOSIS — L858 Other specified epidermal thickening: Secondary | ICD-10-CM

## 2018-05-24 DIAGNOSIS — F411 Generalized anxiety disorder: Secondary | ICD-10-CM

## 2018-05-24 DIAGNOSIS — Z23 Encounter for immunization: Secondary | ICD-10-CM

## 2018-05-24 DIAGNOSIS — R945 Abnormal results of liver function studies: Secondary | ICD-10-CM | POA: Diagnosis not present

## 2018-05-24 DIAGNOSIS — F321 Major depressive disorder, single episode, moderate: Secondary | ICD-10-CM | POA: Diagnosis not present

## 2018-05-24 DIAGNOSIS — R7989 Other specified abnormal findings of blood chemistry: Secondary | ICD-10-CM | POA: Insufficient documentation

## 2018-05-24 DIAGNOSIS — N898 Other specified noninflammatory disorders of vagina: Secondary | ICD-10-CM

## 2018-05-24 MED ORDER — FLUCONAZOLE 150 MG PO TABS: 150.0000 mg | ORAL_TABLET | Freq: Once | ORAL | 0 refills | Status: AC

## 2018-05-24 NOTE — Assessment & Plan Note (Signed)
Likely secondary to Tylenol use in the setting of recent biopsy Recheck LFTs today

## 2018-05-24 NOTE — Assessment & Plan Note (Signed)
Was negative for BV and yeast when checked 2 months ago Continues to have similar vaginal discharge She was also negative for STDs at that time She did not undergo treatment at that time We will treat with Diflucan today presumptively for possible yeast infection Also discussed importance of avoiding feminine hygiene wipes and washes as these tend to be irritating Can use hydrocortisone cream externally as needed

## 2018-05-24 NOTE — Assessment & Plan Note (Signed)
Uncontrolled Chronic and unchanged from last visit She is only started her Zoloft about 2 weeks ago She is not having any side effects currently Again discussed that it can take 6 to 8 weeks to reach full efficacy Again discussed possible side effects including GI upset, sexual dysfunction, increased SI Contracted for safety as patient only has rare passive SI with no plan Advised again on the synergistic effect of medications with therapy She will continue the slow ramp up of her Zoloft dose Follow-up in about 2 months and can consider further increasing Zoloft dose

## 2018-05-24 NOTE — Progress Notes (Signed)
Patient: Katrina Bryant Female    DOB: 08-Mar-1996   22 y.o.   MRN: [id number] Visit Date: 05/24/2018  Today's Provider: Lavon Paganini, MD   Chief Complaint  Patient presents with  . Anxiety  . Depression   Subjective:    I, Tiburcio Pea, CMA, am acting as a scribe for Lavon Paganini, MD.   HPI Anxiety & Depression: Patient presents today for a 2 month follow up. Last OV was on 03/16/2018. Patient was advised to start Zoloft 25 mg and follow up in 2 month to discuss further increase in dose of Zoloft. She reports fair compliance with treatment plan.She reports she did not start taking Zoloft until approximately 2 weeks ago. She states symptoms are unchanged at this time.      Depression screen [facility] 2/9 05/24/2018 03/16/2018  Decreased Interest 2 2  Down, Depressed, Hopeless 3 3  PHQ - 2 Score 5 5  Altered sleeping 3 2  Tired, decreased energy 2 3  Change in appetite 3 3  Feeling bad or failure about yourself  3 2  Trouble concentrating 2 1  Moving slowly or fidgety/restless 1 0  Suicidal thoughts 1 1  PHQ-9 Score 20 17  Difficult doing work/chores Very difficult Very difficult    GAD 7 : Generalized Anxiety Score 05/24/2018 03/16/2018  Nervous, Anxious, on Edge 3 3  Control/stop worrying 3 3  Worry too much - different things 3 3  Trouble relaxing 2 2  Restless 1 1  Easily annoyed or irritable 3 3  Afraid - awful might happen 3 3  Total GAD 7 Score 18 18  Anxiety Difficulty Very difficult Extremely difficult   She also had elevated LFTs when last checked about 2 months ago.  She was taking a lot of Tylenol at that time because she had a recent breast biopsy.  She is no longer taking Tylenol.  She is due to have these rechecked today.  Patient agrees to tetanus and flu shots today.  Patient has a rash that is been on her arms for many years.  She is tried an Aveeno lotion which has not helped.  She also continues to have vaginal discharge.  She does use  feminine hygeine wipes regularly.  She uses [employer].    Allergies  Allergen Reactions  . Cephalosporins   . Amoxicillin Rash  . Bactrim [Sulfamethoxazole-Trimethoprim] Rash  . Biaxin [Clarithromycin] Rash  . Clindamycin/Lincomycin Rash  . Penicillins Rash     Current Outpatient Medications:  .  sertraline (ZOLOFT) 25 MG tablet, Take 1/2 tab PO daily x2wks, then 25mg  PO daily x2 wks, then 50mg  daily x2 wks, then 75mg  daily x2 wks, Disp: 90 tablet, Rfl: 1 .  fluconazole (DIFLUCAN) 150 MG tablet, Take 1 tablet (150 mg total) by mouth once for 1 dose., Disp: 1 tablet, Rfl: 0  Review of Systems  Constitutional: Negative.   HENT: Negative.   Respiratory: Negative.   Cardiovascular: Negative.   Gastrointestinal: Negative.   Genitourinary: Negative.   Musculoskeletal: Negative.   Skin: Positive for rash. Negative for color change and wound.  Neurological: Negative.   Psychiatric/Behavioral: The patient is nervous/anxious.        Depression     Social History   Tobacco Use  . Smoking status: Never Smoker  . Smokeless tobacco: Never Used  Substance Use Topics  . Alcohol use: Never    Frequency: Never   Objective:   BP 124/82 (BP Location: Right  Arm, Patient Position: Sitting, Cuff Size: Normal)   Pulse 94   Temp 99.1 F (37.3 C) (Oral)   Wt 171 lb 3.2 oz (77.7 kg)   SpO2 97%   BMI 29.39 kg/m  Vitals:   05/24/18 0940  BP: 124/82  Pulse: 94  Temp: 99.1 F (37.3 C)  TempSrc: Oral  SpO2: 97%  Weight: 171 lb 3.2 oz (77.7 kg)     Physical Exam  Constitutional: She is oriented to person, place, and time. She appears well-developed and well-nourished. No distress.  HENT:  Head: Normocephalic and atraumatic.  Eyes: Conjunctivae are normal. No scleral icterus.  Neck: Neck supple. No thyromegaly present.  Cardiovascular: Normal rate, regular rhythm, normal heart sounds and intact distal pulses.  No murmur heard. Pulmonary/Chest: Effort normal and breath sounds  normal. No respiratory distress. She has no wheezes. She has no rales.  Musculoskeletal: She exhibits no edema.  Lymphadenopathy:    She has no cervical adenopathy.  Neurological: She is alert and oriented to person, place, and time.  Skin: Skin is warm and dry. Capillary refill takes less than 2 seconds. Rash (keratosis pilaris of b/l arms) noted.  Psychiatric: Her speech is normal and behavior is normal. Her mood appears anxious. Her affect is blunt. She exhibits a depressed mood.  Vitals reviewed.       Assessment & Plan:   Problem List Items Addressed This Visit      Musculoskeletal and Integument   Keratosis pilaris    Discussed healthy skin care Discussed exfoliation and use of AmLactin cream        Other   Vaginal discharge    Was negative for BV and yeast when checked 2 months ago Continues to have similar vaginal discharge She was also negative for STDs at that time She did not undergo treatment at that time We will treat with Diflucan today presumptively for possible yeast infection Also discussed importance of avoiding feminine hygiene wipes and washes as these tend to be irritating Can use hydrocortisone cream externally as needed      GAD (generalized anxiety disorder)    Uncontrolled Chronic and unchanged from last visit She is only started her Zoloft about 2 weeks ago She is not having any side effects currently Again discussed that it can take 6 to 8 weeks to reach full efficacy Again discussed possible side effects including GI upset, sexual dysfunction, increased SI Contracted for safety as patient only has rare passive SI with no plan Advised again on the synergistic effect of medications with therapy She will continue the slow ramp up of her Zoloft dose Follow-up in about 2 months and can consider further increasing Zoloft dose      Depression, major, single episode, moderate (HCC) - Primary    Uncontrolled Chronic and unchanged from last  visit She is only started her Zoloft about 2 weeks ago She is not having any side effects currently Again discussed that it can take 6 to 8 weeks to reach full efficacy Again discussed possible side effects including GI upset, sexual dysfunction, increased SI Contracted for safety as patient only has rare passive SI with no plan Advised again on the synergistic effect of medications with therapy She will continue the slow ramp up of her Zoloft dose Follow-up in about 2 months and can consider further increasing Zoloft dose      Elevated LFTs    Likely secondary to Tylenol use in the setting of recent biopsy Recheck LFTs today  Relevant Orders   Hepatic function panel    Other Visit Diagnoses    Need for influenza vaccination       Relevant Orders   Flu Vaccine QUAD 6+ mos PF IM (Fluarix Quad PF) (Completed)   Need for tetanus booster       Relevant Orders   Td : Tetanus/diphtheria >7yo Preservative  free (Completed)       Return in about 2 months (around 07/24/2018) for depression/anxiety f/u.   The entirety of the information documented in the History of Present Illness, Review of Systems and Physical Exam were personally obtained by me. Portions of this information were initially documented by Tiburcio Pea, CMA and reviewed by me for thoroughness and accuracy.    Virginia Crews, MD, MPH [facility] 05/24/2018 10:52 AM

## 2018-05-24 NOTE — Patient Instructions (Addendum)
Exfoliation and amlactin cream for rash  Stop using feminine wipes/washes Hydrocortisone cream twice daily for irritation or itching Try yeast pill  Call/message when you are on the 75mg  dose of Zoloft so I can get you a refill

## 2018-05-24 NOTE — Assessment & Plan Note (Signed)
Discussed healthy skin care Discussed exfoliation and use of AmLactin cream

## 2018-05-25 LAB — HEPATIC FUNCTION PANEL
ALT: 50 IU/L — AB (ref 0–32)
AST: 30 IU/L (ref 0–40)
Albumin: 4.7 g/dL (ref 3.5–5.5)
Alkaline Phosphatase: 71 IU/L (ref 39–117)
BILIRUBIN, DIRECT: 0.19 mg/dL (ref 0.00–0.40)
Bilirubin Total: 0.7 mg/dL (ref 0.0–1.2)
TOTAL PROTEIN: 7.6 g/dL (ref 6.0–8.5)

## 2018-06-28 ENCOUNTER — Ambulatory Visit (INDEPENDENT_AMBULATORY_CARE_PROVIDER_SITE_OTHER): Payer: BLUE CROSS/BLUE SHIELD | Admitting: Physician Assistant

## 2018-06-28 ENCOUNTER — Encounter: Payer: Self-pay | Admitting: Physician Assistant

## 2018-06-28 VITALS — BP 120/80 | HR 88 | Temp 98.2°F | Resp 16 | Wt 173.8 lb

## 2018-06-28 DIAGNOSIS — B9789 Other viral agents as the cause of diseases classified elsewhere: Secondary | ICD-10-CM

## 2018-06-28 DIAGNOSIS — J069 Acute upper respiratory infection, unspecified: Secondary | ICD-10-CM

## 2018-06-28 DIAGNOSIS — J029 Acute pharyngitis, unspecified: Secondary | ICD-10-CM

## 2018-06-28 LAB — POCT RAPID STREP A (OFFICE): Rapid Strep A Screen: NEGATIVE

## 2018-06-28 MED ORDER — BENZONATATE 100 MG PO CAPS: 100.0000 mg | ORAL_CAPSULE | Freq: Three times a day (TID) | ORAL | 0 refills | Status: AC | PRN

## 2018-06-28 NOTE — Patient Instructions (Signed)
  Counseled regarding signs and symptoms of viral and bacterial respiratory infections. Advised to call or return for additional evaluation if she develops any sign of bacterial infection, or if current symptoms last longer than 10 days.     Viral Respiratory Infection A viral respiratory infection is an illness that affects parts of the body used for breathing, like the lungs, nose, and throat. It is caused by a germ called a virus. Some examples of this kind of infection are:  A cold.  The flu (influenza).  A respiratory syncytial virus (RSV) infection.  How do I know if I have this infection? Most of the time this infection causes:  A stuffy or runny nose.  Yellow or green fluid in the nose.  A cough.  Sneezing.  Tiredness (fatigue).  Achy muscles.  A sore throat.  Sweating or chills.  A fever.  A headache.  How is this infection treated? If the flu is diagnosed early, it may be treated with an antiviral medicine. This medicine shortens the length of time a person has symptoms. Symptoms may be treated with over-the-counter and prescription medicines, such as:  Expectorants. These make it easier to cough up mucus.  Decongestant nasal sprays.  Doctors do not prescribe antibiotic medicines for viral infections. They do not work with this kind of infection. How do I know if I should stay home? To keep others from getting sick, stay home if you have:  A fever.  A lasting cough.  A sore throat.  A runny nose.  Sneezing.  Muscles aches.  Headaches.  Tiredness.  Weakness.  Chills.  Sweating.  An upset stomach (nausea).  Follow these instructions at home:  Rest as much as possible.  Take over-the-counter and prescription medicines only as told by your doctor.  Drink enough fluid to keep your pee (urine) clear or pale yellow.  Gargle with salt water. Do this 3-4 times per day or as needed. To make a salt-water mixture, dissolve -1 tsp of salt  in 1 cup of warm water. Make sure the salt dissolves all the way.  Use nose drops made from salt water. This helps with stuffiness (congestion). It also helps soften the skin around your nose.  Do not drink alcohol.  Do not use tobacco products, including cigarettes, chewing tobacco, and e-cigarettes. If you need help quitting, ask your doctor. Get help if:  Your symptoms last for 10 days or longer.  Your symptoms get worse over time.  You have a fever.  You have very bad pain in your face or forehead.  Parts of your jaw or neck become very swollen. Get help right away if:  You feel pain or pressure in your chest.  You have shortness of breath.  You faint or feel like you will faint.  You keep throwing up (vomiting).  You feel confused. This information is not intended to replace advice given to you by your health care provider. Make sure you discuss any questions you have with your health care provider. Document Released: 07/30/2008 Document Revised: 01/23/2016 Document Reviewed: 01/23/2015 Elsevier Interactive Patient Education  2018 [employer].

## 2018-06-28 NOTE — Progress Notes (Signed)
Patient: Katrina Bryant Female    DOB: Mar 18, 1996   22 y.o.   MRN: [id number] Visit Date: 06/28/2018  Today's Provider: Trinna Post, PA-C   Chief Complaint  Patient presents with  . URI   Subjective:    HPI Upper Respiratory Infection: Patient complains of symptoms of a URI, possible sinusitis. Symptoms include bilateral ear pain, congestion and sore throat. Onset of symptoms was 4 days ago, gradually worsening since that time. She also c/o congestion, non productive cough, sneezing and sore throat for the past 4 weeks .  She is drinking plenty of fluids. Evaluation to date: none. Treatment to date: none.     Allergies  Allergen Reactions  . Cephalosporins   . Amoxicillin Rash  . Bactrim [Sulfamethoxazole-Trimethoprim] Rash  . Biaxin [Clarithromycin] Rash  . Clindamycin/Lincomycin Rash  . Penicillins Rash     Current Outpatient Medications:  .  sertraline (ZOLOFT) 25 MG tablet, Take 1/2 tab PO daily x2wks, then 25mg  PO daily x2 wks, then 50mg  daily x2 wks, then 75mg  daily x2 wks, Disp: 90 tablet, Rfl: 1 .  benzonatate (TESSALON PERLES) 100 MG capsule, Take 1 capsule (100 mg total) by mouth 3 (three) times daily as needed for up to 7 days., Disp: 21 capsule, Rfl: 0  Review of Systems  Constitutional: Positive for fatigue.  HENT: Positive for congestion, ear pain, rhinorrhea, sneezing and sore throat.   Respiratory: Positive for cough.     Social History   Tobacco Use  . Smoking status: Never Smoker  . Smokeless tobacco: Never Used  Substance Use Topics  . Alcohol use: Never    Frequency: Never   Objective:   BP 120/80 (BP Location: Left Arm, Patient Position: Sitting, Cuff Size: Normal)   Pulse 88   Temp 98.2 F (36.8 C) (Oral)   Resp 16   Wt 173 lb 12.8 oz (78.8 kg)   SpO2 98%   BMI 29.83 kg/m  Vitals:   06/28/18 1131  BP: 120/80  Pulse: 88  Resp: 16  Temp: 98.2 F (36.8 C)  TempSrc: Oral  SpO2: 98%  Weight: 173 lb 12.8 oz (78.8  kg)     Physical Exam  Constitutional: She is oriented to person, place, and time. She appears well-developed and well-nourished.  HENT:  Right Ear: External ear normal.  Left Ear: External ear normal.  Mouth/Throat: Oropharynx is clear and moist. No oropharyngeal exudate.  Eyes: Right eye exhibits discharge. Left eye exhibits discharge.  Neck: Neck supple.  Cardiovascular: Normal rate and regular rhythm.  Pulmonary/Chest: Effort normal and breath sounds normal. No respiratory distress. She has no rales.  Lymphadenopathy:    She has no cervical adenopathy.  Neurological: She is alert and oriented to person, place, and time.  Skin: Skin is warm and dry.  Psychiatric: She has a normal mood and affect. Her behavior is normal.        Assessment & Plan:     1. Viral URI with cough  Rapid strep negative. Counseled regarding signs and symptoms of viral and bacterial respiratory infections. Advised to call or return for additional evaluation if she develops any sign of bacterial infection, or if current symptoms last longer than 10 days.   - benzonatate (TESSALON PERLES) 100 MG capsule; Take 1 capsule (100 mg total) by mouth 3 (three) times daily as needed for up to 7 days.  Dispense: 21 capsule; Refill: 0  2. Sore throat  - POCT rapid strep A  Return if symptoms worsen or fail to improve.  The entirety of the information documented in the History of Present Illness, Review of Systems and Physical Exam were personally obtained by me. Portions of this information were initially documented by Lynford Humphrey, CMA and reviewed by me for thoroughness and accuracy.        Trinna Post, PA-C  [facility]

## 2018-07-07 ENCOUNTER — Telehealth: Payer: Self-pay | Admitting: Family Medicine

## 2018-07-07 DIAGNOSIS — J0100 Acute maxillary sinusitis, unspecified: Secondary | ICD-10-CM

## 2018-07-07 MED ORDER — DOXYCYCLINE HYCLATE 100 MG PO TABS: 100.0000 mg | ORAL_TABLET | Freq: Two times a day (BID) | ORAL | 0 refills | Status: AC

## 2018-07-07 NOTE — Telephone Encounter (Signed)
Patient advised via vm per DPR.

## 2018-07-07 NOTE — Telephone Encounter (Signed)
Sent in doxycycline 100 mg BID x 7 days for sinusitis.

## 2018-07-07 NOTE — Telephone Encounter (Signed)
Pt is calling back to let Fabio Bering know she is not better from Oct's visit.  She is still sick.  Pt was told she could call back if not better and asking if Fabio Bering could call her in something.  Please advise.  Thanks, [employer]

## 2018-07-19 ENCOUNTER — Encounter: Payer: Self-pay | Admitting: Family Medicine

## 2018-07-19 ENCOUNTER — Ambulatory Visit (INDEPENDENT_AMBULATORY_CARE_PROVIDER_SITE_OTHER): Payer: BLUE CROSS/BLUE SHIELD | Admitting: Family Medicine

## 2018-07-19 VITALS — BP 133/85 | HR 81 | Temp 98.8°F | Wt 175.2 lb

## 2018-07-19 DIAGNOSIS — F411 Generalized anxiety disorder: Secondary | ICD-10-CM | POA: Diagnosis not present

## 2018-07-19 DIAGNOSIS — Z8619 Personal history of other infectious and parasitic diseases: Secondary | ICD-10-CM

## 2018-07-19 DIAGNOSIS — R945 Abnormal results of liver function studies: Secondary | ICD-10-CM | POA: Diagnosis not present

## 2018-07-19 DIAGNOSIS — R7989 Other specified abnormal findings of blood chemistry: Secondary | ICD-10-CM

## 2018-07-19 DIAGNOSIS — F321 Major depressive disorder, single episode, moderate: Secondary | ICD-10-CM

## 2018-07-19 DIAGNOSIS — N644 Mastodynia: Secondary | ICD-10-CM

## 2018-07-19 MED ORDER — SERTRALINE HCL 50 MG PO TABS: 50.0000 mg | ORAL_TABLET | Freq: Every day | ORAL | 1 refills | Status: DC

## 2018-07-19 MED ORDER — ONDANSETRON HCL 4 MG PO TABS: 4.0000 mg | ORAL_TABLET | Freq: Three times a day (TID) | ORAL | 0 refills | Status: DC | PRN

## 2018-07-19 NOTE — Patient Instructions (Signed)
Cold Sore A cold sore, also called a fever blister, is a skin infection that causes small, fluid-filled sores to form inside of the mouth or on the lips, gums, nose, chin, or cheeks. Cold sores can spread to other parts of the body, such as the eyes or fingers. In some people with other medical conditions, cold sores can spread to multiple other body sites, including the genitals. Cold sores can be spread or passed from person to person (contagious) until the sores crust over completely. What are the causes? Cold sores are caused by the herpes simplex virus (HSV-1). HSV-1 is closely related to the virus that causes genital herpes (HSV-2), but these viruses are not the same. Once a person is infected with HSV-1, the virus remains permanently in the body. HSV-1 is spread from person to person through close contact, such as through kissing, touching the affected area, or sharing personal items such as lip balm, razors, or eating utensils. What increases the risk? A cold sore outbreak is more likely to develop in people who:  Are tired, stressed, or sick.  Are menstruating.  Are pregnant.  Take certain medicines.  Are exposed to cold weather or too much sun.  What are the signs or symptoms? Symptoms of a cold sore outbreak often go through different stages. Here is how a cold sore develops:  Tingling, itching, or burning is felt 1-2 days before the outbreak.  Fluid-filled blisters appear on the lips, inside the mouth, on the nose, or on the cheeks.  The blisters start to ooze clear fluid.  The blisters dry up and a yellow crust appears in its place.  The crust falls off.  Other symptoms include:  Fever.  Sore throat.  Headache.  Muscle aches.  Swollen neck glands.  You also may not have any symptoms. How is this diagnosed? This condition is often diagnosed based on your medical history and a physical exam. Your health care provider may swab your sore and then examine it in  the lab. Rarely, blood tests may be done to check for HSV-1. How is this treated? There is no cure for cold sores or HSV-1. There also is no vaccine for HSV-1. Most cold sores go away on their own without treatment within two weeks. Medicines cannot make the infection go away, but medicines can:  Help relieve some of the pain associated with the sores.  Work to stop the virus from multiplying.  Shorten healing time.  Medicines may be in the form of creams, gels, pills, or a shot. Follow these instructions at home: Medicines  Take or apply over-the-counter and prescription medicines only as told by your health care provider.  Use a cotton-tip swab to apply creams or gels to your sores. Sore Care  Do not touch the sores or pick the scabs.  Wash your hands often. Do not touch your eyes without washing your hands first.  Keep the sores clean and dry.  If directed, apply ice to the sores: ? Put ice in a plastic bag. ? Place a towel between your skin and the bag. ? Leave the ice on for 20 minutes, 2-3 times per day. Lifestyle  Do not kiss, have oral sex, or share personal items until your sores heal.  Eat a soft, bland diet. Avoid eating hot, cold, or salty foods. These can hurt your mouth.  Use a straw if it hurts to drink out of a glass.  Avoid the sun and limit your stress if these things   trigger outbreaks. If sun causes cold sores, apply sunscreen on your lips before being out in the sun. Contact a health care provider if:  You have symptoms for more than two weeks.  You have pus coming from the sores.  You have redness that is spreading.  You have pain or irritation in your eye.  You get sores on your genitals.  Your sores do not heal within two weeks.  You have frequent cold sore outbreaks. Get help right away if:  You have a fever and your symptoms suddenly get worse.  You have a headache and confusion. This information is not intended to replace advice  given to you by your health care provider. Make sure you discuss any questions you have with your health care provider. Document Released: 08/14/2000 Document Revised: 04/10/2016 Document Reviewed: 06/07/2015 Elsevier Interactive Patient Education  2018 [employer]  

## 2018-07-19 NOTE — Progress Notes (Signed)
Patient: Katrina Bryant Female    DOB: 1996-02-10   21 y.o.   MRN: [id number] Visit Date: 07/19/2018  Today's Provider: Lavon Paganini, MD   Chief Complaint  Patient presents with  . Anxiety  . Depression   Subjective:    HPI Anxiety & Depression:  Patient presents for a 2 month follow up. Last OV was on 05/24/2018. Patient advised to continue Zoloft and follow up in 2 months to consider increasing dose. She reports good compliance with treatment plan Patient states she is currently taking 25 mg daily.  She tried taking 50mg  daily x2 days and went back to 25mg  due to nausea.  She states symptoms are unchanged.   GAD 7 : Generalized Anxiety Score 05/24/2018 03/16/2018  Nervous, Anxious, on Edge 3 3  Control/stop worrying 3 3  Worry too much - different things 3 3  Trouble relaxing 2 2  Restless 1 1  Easily annoyed or irritable 3 3  Afraid - awful might happen 3 3  Total GAD 7 Score 18 18  Anxiety Difficulty Very difficult Extremely difficult    Depression screen [facility] 2/9 05/24/2018 03/16/2018  Decreased Interest 2 2  Down, Depressed, Hopeless 3 3  PHQ - 2 Score 5 5  Altered sleeping 3 2  Tired, decreased energy 2 3  Change in appetite 3 3  Feeling bad or failure about yourself  3 2  Trouble concentrating 2 1  Moving slowly or fidgety/restless 1 0  Suicidal thoughts 1 1  PHQ-9 Score 20 17  Difficult doing work/chores Very difficult Very difficult    Patient is concerned and embarrass to discuss that she had STD panel ordered by an online service.  She was negative for all that was tested, with the exception of HSV-1.  She has been worried about this for over a month.  She was told by the online service that she could pay for a visit there E provider and get antivirals if she wanted.  She has been asymptomatic.  She does not think she is ever had a cold sore.  Her partner was also positive for HSV-1.  Patient is also concerned about her right breast pain.  This  is been ongoing for several months.  She underwent imaging and biopsy which were benign.  She was previously having nipple discharge and her prolactin level is slightly elevated.  She has had no further nipple discharge.  She is to think this is related to her menses, but that seems to come on randomly.  The pain is described as sharp.  She has not followed up with her gynecologist.    Allergies  Allergen Reactions  . Cephalosporins   . Amoxicillin Rash  . Bactrim [Sulfamethoxazole-Trimethoprim] Rash  . Biaxin [Clarithromycin] Rash  . Clindamycin/Lincomycin Rash  . Penicillins Rash     Current Outpatient Medications:  .  sertraline (ZOLOFT) 25 MG tablet, Take 1/2 tab PO daily x2wks, then 25mg  PO daily x2 wks, then 50mg  daily x2 wks, then 75mg  daily x2 wks, Disp: 90 tablet, Rfl: 1  Review of Systems  Constitutional: Negative.   Respiratory: Negative.   Cardiovascular: Negative.   Musculoskeletal: Negative.   Psychiatric/Behavioral: The patient is nervous/anxious.        Depression     Social History   Tobacco Use  . Smoking status: Never Smoker  . Smokeless tobacco: Never Used  Substance Use Topics  . Alcohol use: Never    Frequency: Never  Objective:   BP 133/85 (BP Location: Right Arm, Patient Position: Sitting, Cuff Size: Normal)   Pulse 81   Temp 98.8 F (37.1 C) (Oral)   Wt 175 lb 3.2 oz (79.5 kg)   SpO2 97%   BMI 30.07 kg/m  Vitals:   07/19/18 1054  BP: 133/85  Pulse: 81  Temp: 98.8 F (37.1 C)  TempSrc: Oral  SpO2: 97%  Weight: 175 lb 3.2 oz (79.5 kg)     Physical Exam  Constitutional: She is oriented to person, place, and time. She appears well-developed and well-nourished. No distress.  HENT:  Head: Normocephalic and atraumatic.  Mouth/Throat: Oropharynx is clear and moist.  Eyes: Pupils are equal, round, and reactive to light. Conjunctivae are normal. No scleral icterus.  Neck: Neck supple. No thyromegaly present.  Cardiovascular: Normal rate,  regular rhythm, normal heart sounds and intact distal pulses.  No murmur heard. Pulmonary/Chest: Effort normal and breath sounds normal. No respiratory distress. She has no wheezes. She has no rales.  Musculoskeletal: She exhibits no edema or deformity.  Lymphadenopathy:    She has no cervical adenopathy.  Neurological: She is alert and oriented to person, place, and time.  Skin: Skin is warm and dry. Capillary refill takes less than 2 seconds. No rash noted.  Psychiatric: Her speech is normal and behavior is normal. Thought content normal. Her mood appears anxious. Her affect is blunt.  Vitals reviewed.       Assessment & Plan:   Problem List Items Addressed This Visit      Other   GAD (generalized anxiety disorder) - Primary    Uncontrolled Chronic and unchanged from last visit She continues to take very low-dose Zoloft We discussed potential side effects and that it can take 6 to 8 weeks to reach full efficacy She is contracted for safety as she has rare passive SI with no plan Advised on the synergistic effects of medication and therapy We will increase her Zoloft to 50 mg daily She is given a small amount of Zofran to use as needed for GI upset as she increases the dose We will follow-up in 1 month and consider further increase of Zoloft      Relevant Medications   sertraline (ZOLOFT) 50 MG tablet   Mastalgia in female    Has had benign work-up up until this point She did have mildly elevated prolactin Advised to follow-up with her gynecologist      Depression, major, single episode, moderate ([place])    Uncontrolled Chronic and unchanged from last visit She has not increased her Zoloft and continues to take 25 mg daily Discussed that if she will take a higher dose for longer than GI upset will improve We will also give her Zofran to take for a few days when she increases the dose We discussed it can take 6 to 8 weeks to reach full efficacy Discussed potential side  effects including GI upset Contracted for safety as patient is having passive SI with no plan Advised again on the synergistic effect of medications with therapy We will increase Zoloft to 50 mg daily and follow-up in 1 month      Relevant Medications   sertraline (ZOLOFT) 50 MG tablet   Elevated LFTs    Thought to be likely secondary to Tylenol use in the past LFTs were improving on last check We will recheck again today      History of PCR DNA positive for HSV1    Discussed the prvalence  of HSV1 Does not need antivirals as she is asymptomatic and has never had an outbreak       Other Visit Diagnoses    LFT elevation       Relevant Orders   Hepatic function panel (Completed)       Return in about 4 weeks (around 08/16/2018) for depression/anxiety f/u.   The entirety of the information documented in the History of Present Illness, Review of Systems and Physical Exam were personally obtained by me. Portions of this information were initially documented by Tiburcio Pea and Hurman Horn, CMA and reviewed by me for thoroughness and accuracy.    Virginia Crews, MD, MPH [facility] 07/20/2018 9:15 AM

## 2018-07-20 ENCOUNTER — Telehealth: Payer: Self-pay

## 2018-07-20 DIAGNOSIS — Z8619 Personal history of other infectious and parasitic diseases: Secondary | ICD-10-CM | POA: Insufficient documentation

## 2018-07-20 LAB — HEPATIC FUNCTION PANEL
ALBUMIN: 4.6 g/dL (ref 3.5–5.5)
ALK PHOS: 68 IU/L (ref 39–117)
ALT: 76 IU/L — ABNORMAL HIGH (ref 0–32)
AST: 52 IU/L — AB (ref 0–40)
BILIRUBIN TOTAL: 0.8 mg/dL (ref 0.0–1.2)
Bilirubin, Direct: 0.2 mg/dL (ref 0.00–0.40)
Total Protein: 7.3 g/dL (ref 6.0–8.5)

## 2018-07-20 NOTE — Assessment & Plan Note (Signed)
Uncontrolled Chronic and unchanged from last visit She has not increased her Zoloft and continues to take 25 mg daily Discussed that if she will take a higher dose for longer than GI upset will improve We will also give her Zofran to take for a few days when she increases the dose We discussed it can take 6 to 8 weeks to reach full efficacy Discussed potential side effects including GI upset Contracted for safety as patient is having passive SI with no plan Advised again on the synergistic effect of medications with therapy We will increase Zoloft to 50 mg daily and follow-up in 1 month

## 2018-07-20 NOTE — Telephone Encounter (Signed)
OK.  Continue to avoid these and we will check again at next visit, along with some other labs.  Virginia Crews, MD, MPH [facility] 07/20/2018 2:07 PM

## 2018-07-20 NOTE — Telephone Encounter (Signed)
Patient advised. She states she does not drink alcohol or take Tylenol.

## 2018-07-20 NOTE — Telephone Encounter (Signed)
-----   Message from Virginia Crews, MD sent at 07/20/2018 10:04 AM EST ----- LFTs remain slightly elevated.  Taking any tylenol or drinking any alcohol?

## 2018-07-20 NOTE — Telephone Encounter (Signed)
Pt returned missed call.  Pt call pt back with lab results.  Thanks, [employer]

## 2018-07-20 NOTE — Assessment & Plan Note (Signed)
Thought to be likely secondary to Tylenol use in the past LFTs were improving on last check We will recheck again today

## 2018-07-20 NOTE — Assessment & Plan Note (Signed)
Discussed the prvalence of HSV1 Does not need antivirals as she is asymptomatic and has never had an outbreak

## 2018-07-20 NOTE — Telephone Encounter (Signed)
Pt advised information on avoiding alcohol and tylenol til next visit.  dbs

## 2018-07-20 NOTE — Telephone Encounter (Signed)
LMTCB

## 2018-07-20 NOTE — Assessment & Plan Note (Signed)
Uncontrolled Chronic and unchanged from last visit She continues to take very low-dose Zoloft We discussed potential side effects and that it can take 6 to 8 weeks to reach full efficacy She is contracted for safety as she has rare passive SI with no plan Advised on the synergistic effects of medication and therapy We will increase her Zoloft to 50 mg daily She is given a small amount of Zofran to use as needed for GI upset as she increases the dose We will follow-up in 1 month and consider further increase of Zoloft

## 2018-07-20 NOTE — Assessment & Plan Note (Signed)
Has had benign work-up up until this point She did have mildly elevated prolactin Advised to follow-up with her gynecologist

## 2018-07-26 ENCOUNTER — Ambulatory Visit: Payer: BLUE CROSS/BLUE SHIELD | Admitting: Family Medicine

## 2018-08-16 ENCOUNTER — Encounter: Payer: Self-pay | Admitting: Family Medicine

## 2018-08-16 ENCOUNTER — Ambulatory Visit (INDEPENDENT_AMBULATORY_CARE_PROVIDER_SITE_OTHER): Payer: BLUE CROSS/BLUE SHIELD | Admitting: Family Medicine

## 2018-08-16 VITALS — BP 132/85 | HR 88 | Temp 98.3°F | Wt 175.0 lb

## 2018-08-16 DIAGNOSIS — F411 Generalized anxiety disorder: Secondary | ICD-10-CM | POA: Diagnosis not present

## 2018-08-16 DIAGNOSIS — F321 Major depressive disorder, single episode, moderate: Secondary | ICD-10-CM | POA: Diagnosis not present

## 2018-08-16 DIAGNOSIS — R945 Abnormal results of liver function studies: Secondary | ICD-10-CM

## 2018-08-16 DIAGNOSIS — R7989 Other specified abnormal findings of blood chemistry: Secondary | ICD-10-CM

## 2018-08-16 MED ORDER — ONDANSETRON HCL 4 MG PO TABS: 4.0000 mg | ORAL_TABLET | Freq: Three times a day (TID) | ORAL | 2 refills | Status: DC | PRN

## 2018-08-16 MED ORDER — SERTRALINE HCL 50 MG PO TABS: 100.0000 mg | ORAL_TABLET | Freq: Every day | ORAL | 1 refills | Status: DC

## 2018-08-16 NOTE — Patient Instructions (Signed)
[email or web address] for therapists   Living With Anxiety After being diagnosed with an anxiety disorder, you may be relieved to know why you have felt or behaved a certain way. It is natural to also feel overwhelmed about the treatment ahead and what it will mean for your life. With care and support, you can manage this condition and recover from it. How to cope with anxiety Dealing with stress Stress is your body's reaction to life changes and events, both good and bad. Stress can last just a few hours or it can be ongoing. Stress can play a major role in anxiety, so it is important to learn both how to cope with stress and how to think about it differently. Talk with your health care provider or a counselor to learn more about stress reduction. He or she may suggest some stress reduction techniques, such as:  Music therapy. This can include creating or listening to music that you enjoy and that inspires you.  Mindfulness-based meditation. This involves being aware of your normal breaths, rather than trying to control your breathing. It can be done while sitting or walking.  Centering prayer. This is a kind of meditation that involves focusing on a word, phrase, or sacred image that is meaningful to you and that brings you peace.  Deep breathing. To do this, expand your stomach and inhale slowly through your nose. Hold your breath for 3-5 seconds. Then exhale slowly, allowing your stomach muscles to relax.  Self-talk. This is a skill where you identify thought patterns that lead to anxiety reactions and correct those thoughts.  Muscle relaxation. This involves tensing muscles then relaxing them.  Choose a stress reduction technique that fits your lifestyle and personality. Stress reduction techniques take time and practice. Set aside 5-15 minutes a day to do them. Therapists can offer training in these techniques. The training may be covered by some insurance plans. Other things you can do to  manage stress include:  Keeping a stress diary. This can help you learn what triggers your stress and ways to control your response.  Thinking about how you respond to certain situations. You may not be able to control everything, but you can control your reaction.  Making time for activities that help you relax, and not feeling guilty about spending your time in this way.  Therapy combined with coping and stress-reduction skills provides the best chance for successful treatment. Medicines Medicines can help ease symptoms. Medicines for anxiety include:  Anti-anxiety drugs.  Antidepressants.  Beta-blockers.  Medicines may be used as the main treatment for anxiety disorder, along with therapy, or if other treatments are not working. Medicines should be prescribed by a health care provider. Relationships Relationships can play a big part in helping you recover. Try to spend more time connecting with trusted friends and family members. Consider going to couples counseling, taking family education classes, or going to family therapy. Therapy can help you and others better understand the condition. How to recognize changes in your condition Everyone has a different response to treatment for anxiety. Recovery from anxiety happens when symptoms decrease and stop interfering with your daily activities at home or work. This may mean that you will start to:  Have better concentration and focus.  Sleep better.  Be less irritable.  Have more energy.  Have improved memory.  It is important to recognize when your condition is getting worse. Contact your health care provider if your symptoms interfere with home or work and you do  not feel like your condition is improving. Where to find help and support: You can get help and support from these sources:  Self-help groups.  Online and [employer].  A trusted spiritual leader.  Couples counseling.  Family education  classes.  Family therapy.  Follow these instructions at home:  Eat a healthy diet that includes plenty of vegetables, fruits, whole grains, low-fat dairy products, and lean protein. Do not eat a lot of foods that are high in solid fats, added sugars, or salt.  Exercise. Most adults should do the following: ? Exercise for at least 150 minutes each week. The exercise should increase your heart rate and make you sweat (moderate-intensity exercise). ? Strengthening exercises at least twice a week.  Cut down on caffeine, tobacco, alcohol, and other potentially harmful substances.  Get the right amount and quality of sleep. Most adults need 7-9 hours of sleep each night.  Make choices that simplify your life.  Take over-the-counter and prescription medicines only as told by your health care provider.  Avoid caffeine, alcohol, and certain over-the-counter cold medicines. These may make you feel worse. Ask your pharmacist which medicines to avoid.  Keep all follow-up visits as told by your health care provider. This is important. Questions to ask your health care provider  Would I benefit from therapy?  How often should I follow up with a health care provider?  How long do I need to take medicine?  Are there any long-term side effects of my medicine?  Are there any alternatives to taking medicine? Contact a health care provider if:  You have a hard time staying focused or finishing daily tasks.  You spend many hours a day feeling worried about everyday life.  You become exhausted by worry.  You start to have headaches, feel tense, or have nausea.  You urinate more than normal.  You have diarrhea. Get help right away if:  You have a racing heart and shortness of breath.  You have thoughts of hurting yourself or others. If you ever feel like you may hurt yourself or others, or have thoughts about taking your own life, get help right away. You can go to your nearest emergency  department or call:  Your local emergency services (911 in the [place]  A suicide crisis helpline, such as the [facility] at 207-378-8767. This is open 24-hours a day.  Summary  Taking steps to deal with stress can help calm you.  Medicines cannot cure anxiety disorders, but they can help ease symptoms.  Family, friends, and partners can play a big part in helping you recover from an anxiety disorder. This information is not intended to replace advice given to you by your health care provider. Make sure you discuss any questions you have with your health care provider. Document Released: 08/11/2016 Document Revised: 08/11/2016 Document Reviewed: 08/11/2016 Elsevier Interactive Patient Education  [employer].

## 2018-08-16 NOTE — Progress Notes (Signed)
Patient: Katrina Bryant Female    DOB: 10-09-1995   22 y.o.   MRN: [id number] Visit Date: 08/16/2018  Today's Provider: Lavon Paganini, MD   Chief Complaint  Patient presents with  . Anxiety  . Depression   Subjective:    I, Tiburcio Pea, CMA, am acting as a scribe for Lavon Paganini, MD.   HPI Anxiety & Depression:  Patient presents for a 1 month follow up. Last OV was on 07/19/2018. Patient advised to increase Zoloft to 50 mg, started Zofran PRN for nausea as she increases the dose of Zoloft, and follow up in 1 month to consider increasing dose. She reports good compliance with treatment plan. She states symptoms are slightly improved.  Depression screen [facility] 2/9 08/16/2018 07/19/2018 05/24/2018  Decreased Interest 1 1 2   Down, Depressed, Hopeless 2 2 3   PHQ - 2 Score 3 3 5   Altered sleeping 3 2 3   Tired, decreased energy 2 1 2   Change in appetite 2 1 3   Feeling bad or failure about yourself  2 2 3   Trouble concentrating 1 1 2   Moving slowly or fidgety/restless 1 1 1   Suicidal thoughts 2 2 1   PHQ-9 Score 16 13 20   Difficult doing work/chores Very difficult Very difficult Very difficult    GAD 7 : Generalized Anxiety Score 08/16/2018 07/19/2018 05/24/2018 03/16/2018  Nervous, Anxious, on Edge 3 3 3 3   Control/stop worrying 3 3 3 3   Worry too much - different things 3 2 3 3   Trouble relaxing 3 2 2 2   Restless 1 2 1 1   Easily annoyed or irritable 3 1 3 3   Afraid - awful might happen 3 1 3 3   Total GAD 7 Score 19 14 18 18   Anxiety Difficulty Very difficult Very difficult Very difficult Extremely difficult      Allergies  Allergen Reactions  . Cephalosporins   . Amoxicillin Rash  . Bactrim [Sulfamethoxazole-Trimethoprim] Rash  . Biaxin [Clarithromycin] Rash  . Clindamycin/Lincomycin Rash  . Penicillins Rash     Current Outpatient Medications:  .  ondansetron (ZOFRAN) 4 MG tablet, Take 1 tablet (4 mg total) by mouth every 8 (eight) hours as  needed for nausea or vomiting., Disp: 20 tablet, Rfl: 2 .  sertraline (ZOLOFT) 50 MG tablet, Take 2 tablets (100 mg total) by mouth at bedtime., Disp: 60 tablet, Rfl: 1  Review of Systems  Constitutional: Negative.   Respiratory: Negative.   Cardiovascular: Negative.   Psychiatric/Behavioral: The patient is nervous/anxious.        Depression     Social History   Tobacco Use  . Smoking status: Never Smoker  . Smokeless tobacco: Never Used  Substance Use Topics  . Alcohol use: Never    Frequency: Never      Objective:   BP 132/85 (BP Location: Left Arm, Patient Position: Sitting, Cuff Size: Normal)   Pulse 88   Temp 98.3 F (36.8 C) (Oral)   Wt 175 lb (79.4 kg)   SpO2 98%   BMI 30.04 kg/m  Vitals:   08/16/18 0951  BP: 132/85  Pulse: 88  Temp: 98.3 F (36.8 C)  TempSrc: Oral  SpO2: 98%  Weight: 175 lb (79.4 kg)     Physical Exam Vitals signs reviewed.  Constitutional:      General: She is not in acute distress.    Appearance: Normal appearance.  HENT:     Head: Normocephalic and atraumatic.     Mouth/Throat:  Pharynx: Oropharynx is clear.  Eyes:     General: No scleral icterus.    Conjunctiva/sclera: Conjunctivae normal.  Neck:     Musculoskeletal: Neck supple.  Cardiovascular:     Rate and Rhythm: Normal rate and regular rhythm.     Pulses: Normal pulses.     Heart sounds: Normal heart sounds. No murmur.  Pulmonary:     Effort: Pulmonary effort is normal. No respiratory distress.     Breath sounds: Normal breath sounds. No wheezing or rhonchi.  Abdominal:     General: There is no distension.     Tenderness: There is no abdominal tenderness.  Musculoskeletal:     Right lower leg: No edema.     Left lower leg: No edema.  Lymphadenopathy:     Cervical: No cervical adenopathy.  Skin:    General: Skin is warm and dry.     Capillary Refill: Capillary refill takes less than 2 seconds.     Findings: No rash.  Neurological:     Mental Status: She  is alert and oriented to person, place, and time. Mental status is at baseline.  Psychiatric:        Mood and Affect: Mood is anxious and depressed. Affect is flat.        Speech: Speech normal.        Behavior: Behavior normal. Behavior is cooperative.        Thought Content: Thought content does not include homicidal or suicidal ideation.         Assessment & Plan   Problem List Items Addressed This Visit      Other   GAD (generalized anxiety disorder) - Primary    Chronic, uncontrolled, worsening She continues to take low-dose Zoloft and is nervous about increasing her dose She agrees to increase her Zoloft to 100 mg daily She is given a small amount of Zofran to use as needed for nausea as she increases the dose We discussed potential side effects and that it can take 6 to 8 weeks to reach full efficacy Advised allergist take effects of medication and therapy Not currently doing therapy, but encouraged rest She is contracted for safety for her rare passive SI with no plan      Relevant Medications   sertraline (ZOLOFT) 50 MG tablet   Depression, major, single episode, moderate ([facility])    Chronic and uncontrolled Patient continues to take low-dose Zoloft She is tolerating this better now than she did in the past She is still hesitant to increase the dose, but agrees to increase to 100 mg daily She has a small dose of Zofran that she can use as needed for GI upset and nausea She is contracted for safety We discussed it can take 6 to 8 weeks to reach full efficacy She was advised again on the synergistic effects of medications with therapy and encouraged to find a therapist      Relevant Medications   sertraline (ZOLOFT) 50 MG tablet   Elevated LFTs    Chronically mildly elevated Thought to be likely secondary to Tylenol use in the past, but they remained elevated on last check even though she is not taking Tylenol Patient does not drink any alcohol Recheck today with  hepatitis panel and iron panel Could consider right upper quadrant ultrasound in the future      Relevant Orders   Hepatic function panel (Completed)   Fe+TIBC+Fer (Completed)   Hepatitis, Acute (Completed)       Return  in about 2 months (around 10/17/2018) for Depression/anxiety f/u.   The entirety of the information documented in the History of Present Illness, Review of Systems and Physical Exam were personally obtained by me. Portions of this information were initially documented by Tiburcio Pea, CMA and reviewed by me for thoroughness and accuracy.    Virginia Crews, MD, MPH [facility] 08/17/2018 10:52 AM

## 2018-08-17 LAB — IRON,TIBC AND FERRITIN PANEL
FERRITIN: 45 ng/mL (ref 15–150)
Iron Saturation: 24 % (ref 15–55)
Iron: 76 ug/dL (ref 27–159)
TIBC: 318 ug/dL (ref 250–450)
UIBC: 242 ug/dL (ref 131–425)

## 2018-08-17 LAB — HEPATITIS PANEL, ACUTE
HEP A IGM: NEGATIVE
HEP B C IGM: NEGATIVE
Hepatitis B Surface Ag: NEGATIVE

## 2018-08-17 LAB — HEPATIC FUNCTION PANEL
ALK PHOS: 78 IU/L (ref 39–117)
ALT: 49 IU/L — ABNORMAL HIGH (ref 0–32)
AST: 29 IU/L (ref 0–40)
Albumin: 4.6 g/dL (ref 3.5–5.5)
BILIRUBIN, DIRECT: 0.21 mg/dL (ref 0.00–0.40)
Bilirubin Total: 0.8 mg/dL (ref 0.0–1.2)
TOTAL PROTEIN: 7.4 g/dL (ref 6.0–8.5)

## 2018-08-17 NOTE — Assessment & Plan Note (Signed)
Chronic, uncontrolled, worsening She continues to take low-dose Zoloft and is nervous about increasing her dose She agrees to increase her Zoloft to 100 mg daily She is given a small amount of Zofran to use as needed for nausea as she increases the dose We discussed potential side effects and that it can take 6 to 8 weeks to reach full efficacy Advised allergist take effects of medication and therapy Not currently doing therapy, but encouraged rest She is contracted for safety for her rare passive SI with no plan

## 2018-08-17 NOTE — Assessment & Plan Note (Signed)
Chronically mildly elevated Thought to be likely secondary to Tylenol use in the past, but they remained elevated on last check even though she is not taking Tylenol Patient does not drink any alcohol Recheck today with hepatitis panel and iron panel Could consider right upper quadrant ultrasound in the future

## 2018-08-17 NOTE — Assessment & Plan Note (Signed)
Chronic and uncontrolled Patient continues to take low-dose Zoloft She is tolerating this better now than she did in the past She is still hesitant to increase the dose, but agrees to increase to 100 mg daily She has a small dose of Zofran that she can use as needed for GI upset and nausea She is contracted for safety We discussed it can take 6 to 8 weeks to reach full efficacy She was advised again on the synergistic effects of medications with therapy and encouraged to find a therapist

## 2018-08-18 ENCOUNTER — Ambulatory Visit: Payer: Self-pay | Admitting: Family Medicine

## 2018-09-21 DIAGNOSIS — B9689 Other specified bacterial agents as the cause of diseases classified elsewhere: Secondary | ICD-10-CM | POA: Diagnosis not present

## 2018-09-21 DIAGNOSIS — J019 Acute sinusitis, unspecified: Secondary | ICD-10-CM | POA: Diagnosis not present

## 2018-09-21 DIAGNOSIS — J209 Acute bronchitis, unspecified: Secondary | ICD-10-CM | POA: Diagnosis not present

## 2018-09-21 DIAGNOSIS — Z881 Allergy status to other antibiotic agents status: Secondary | ICD-10-CM | POA: Diagnosis not present

## 2018-10-18 ENCOUNTER — Ambulatory Visit: Payer: BLUE CROSS/BLUE SHIELD | Admitting: Family Medicine

## 2019-01-19 ENCOUNTER — Telehealth: Payer: Self-pay

## 2019-01-19 NOTE — Telephone Encounter (Signed)
[facility] to complete PHQ9 screening.

## 2019-02-05 IMAGING — MG MM DIGITAL DIAGNOSTIC BILAT W/ TOMO W/ CAD
8 of 17 series · 8 of 40 positions shown · non-contrast
Comparison: Baseline exam

CLINICAL DATA: Patient presents for evaluation of breast pain and
spontaneous RIGHT nipple discharge. She reports diffuse bilateral
breast pain RIGHT greater than LEFT. Initially pain was
intermittent. However, pain has become more constant [REDACTED], and
can cause her to cry. She reports normal cycles, and no exogenous
hormones. The patient also reports spontaneous [REDACTED] to red
discharge from the RIGHT breast. She has seen discharge in her bra
on several occasions.

EXAM:
DIGITAL DIAGNOSTIC BILATERAL MAMMOGRAM WITH CAD AND TOMO
ULTRASOUND RIGHT BREAST

[R ML]
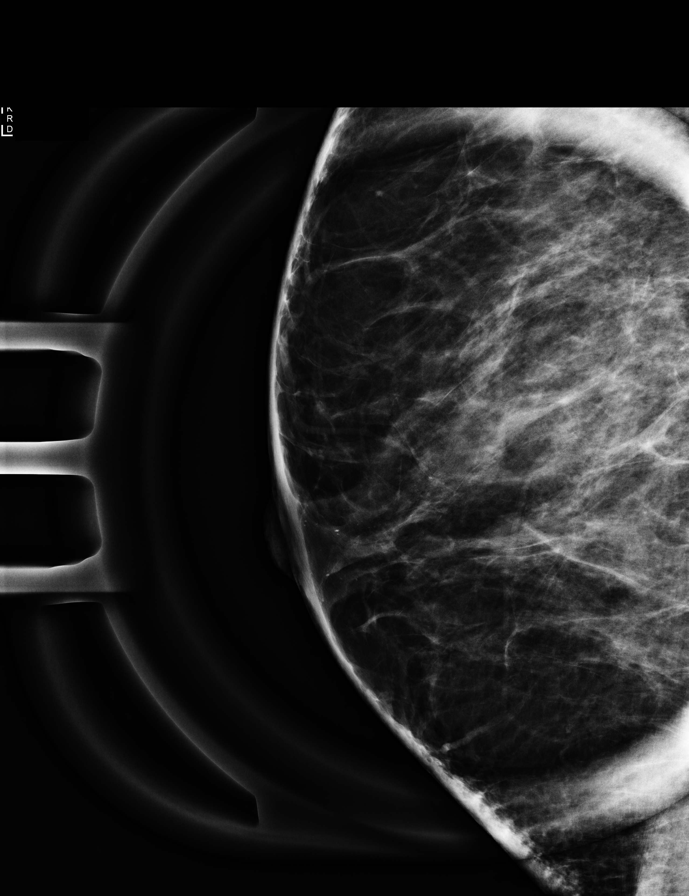

[R CC]
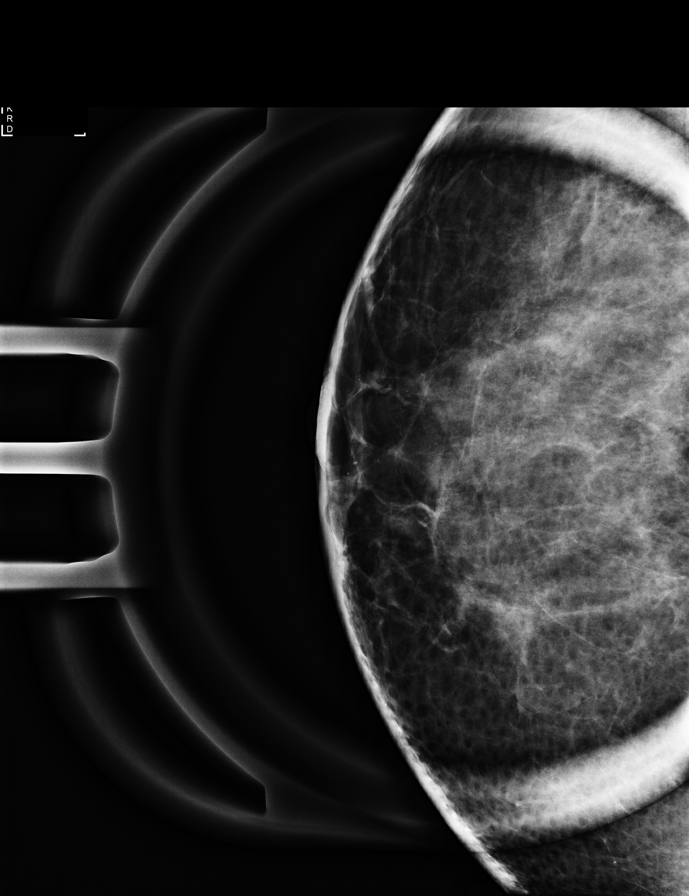

[R ML synth-2D]
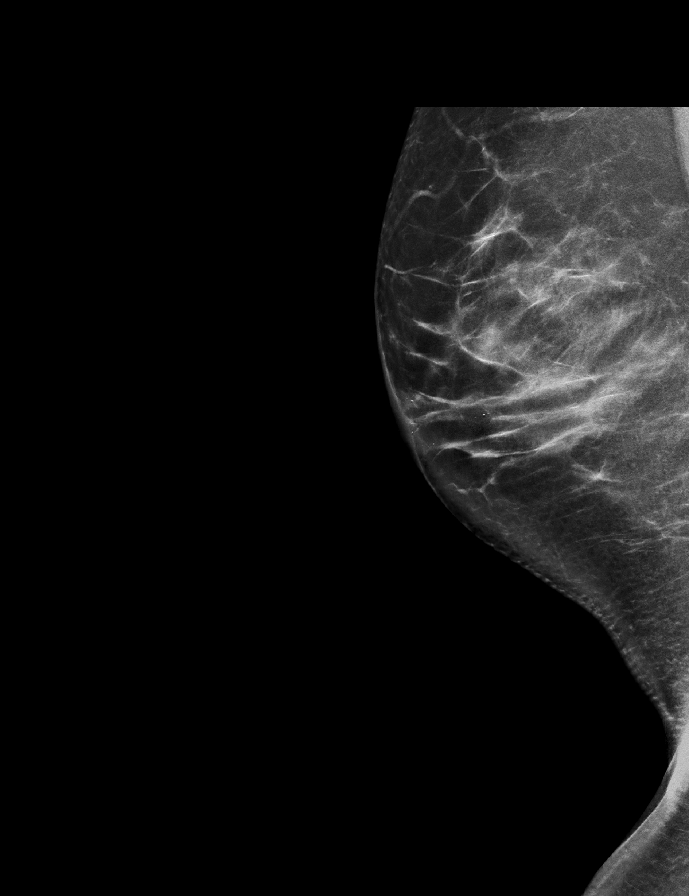

[L MLO synth-2D]
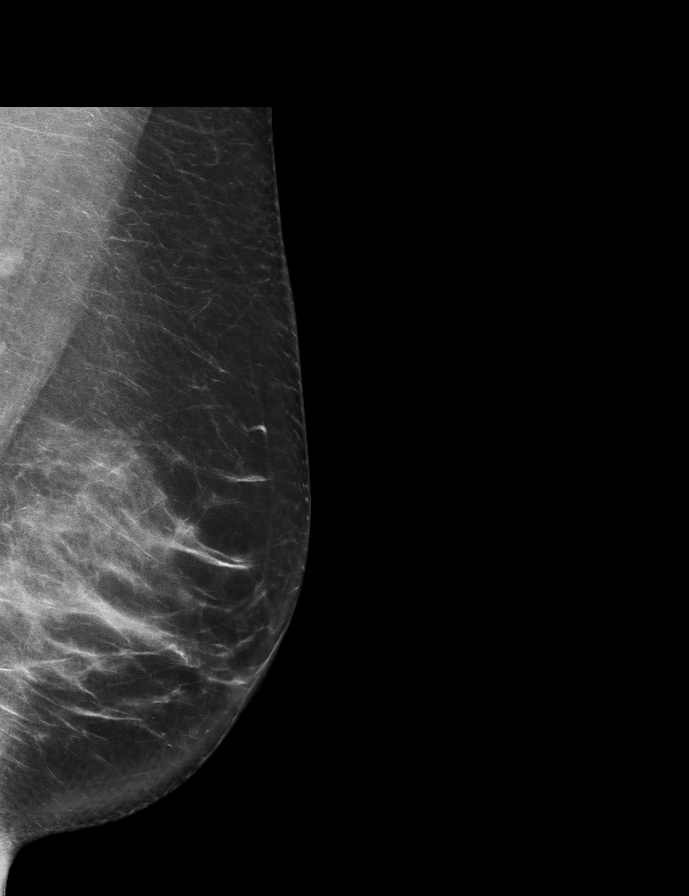

[R CC synth-2D]
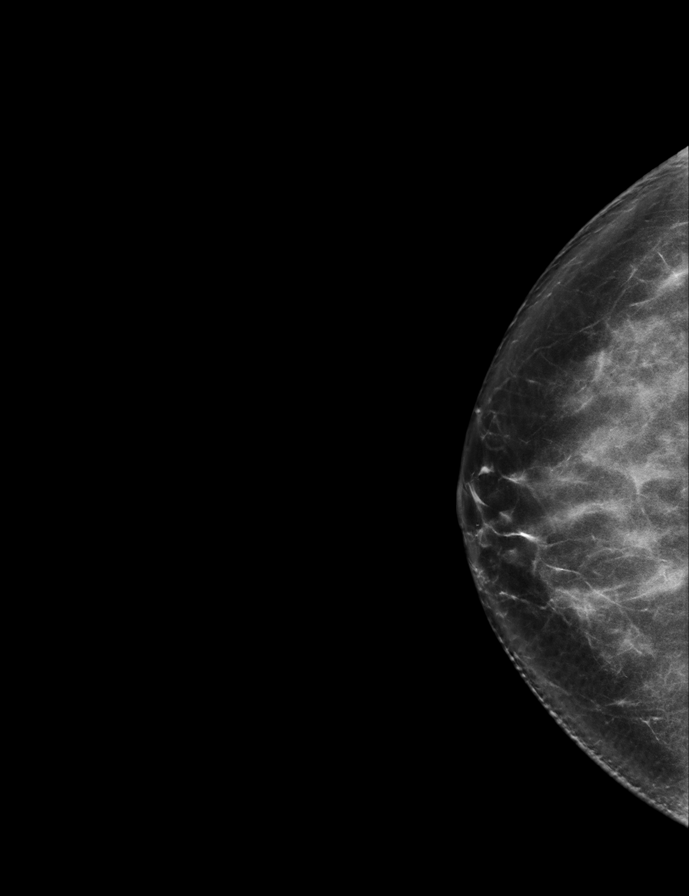

[R MLO synth-2D (1 of 3)]
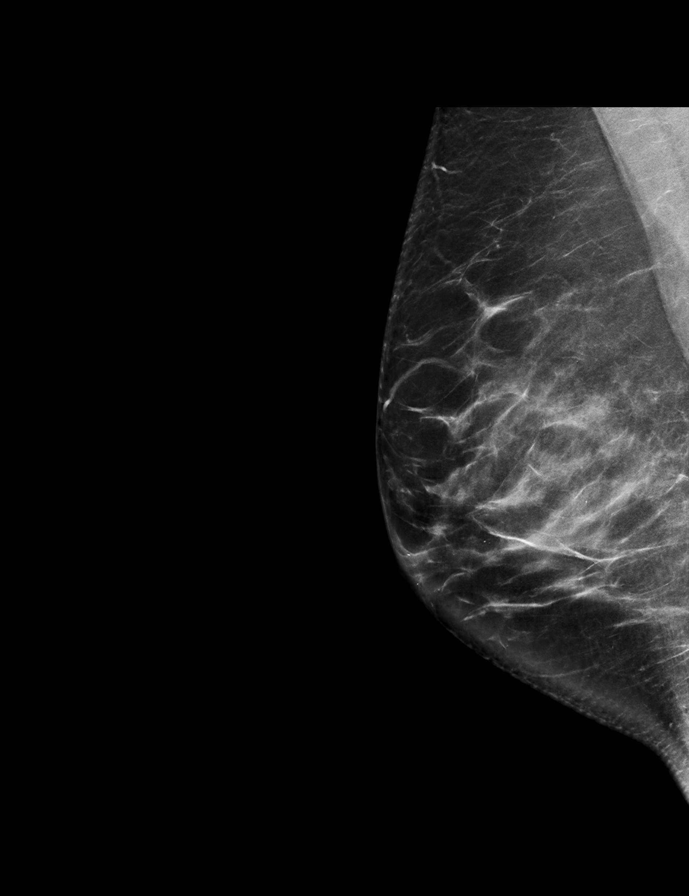

[R MLO synth-2D (2 of 3)]
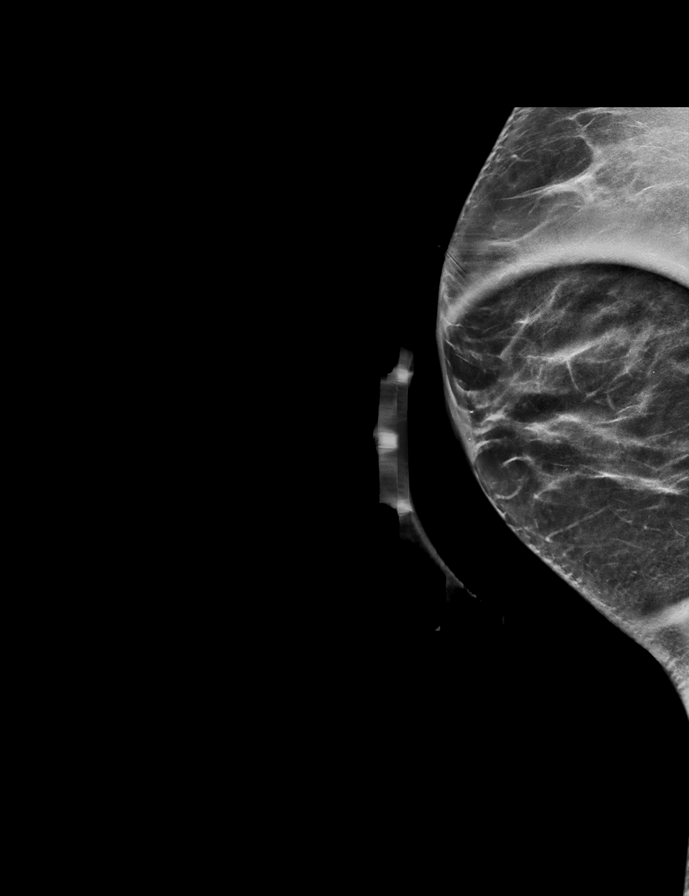

[R MLO synth-2D (3 of 3)]
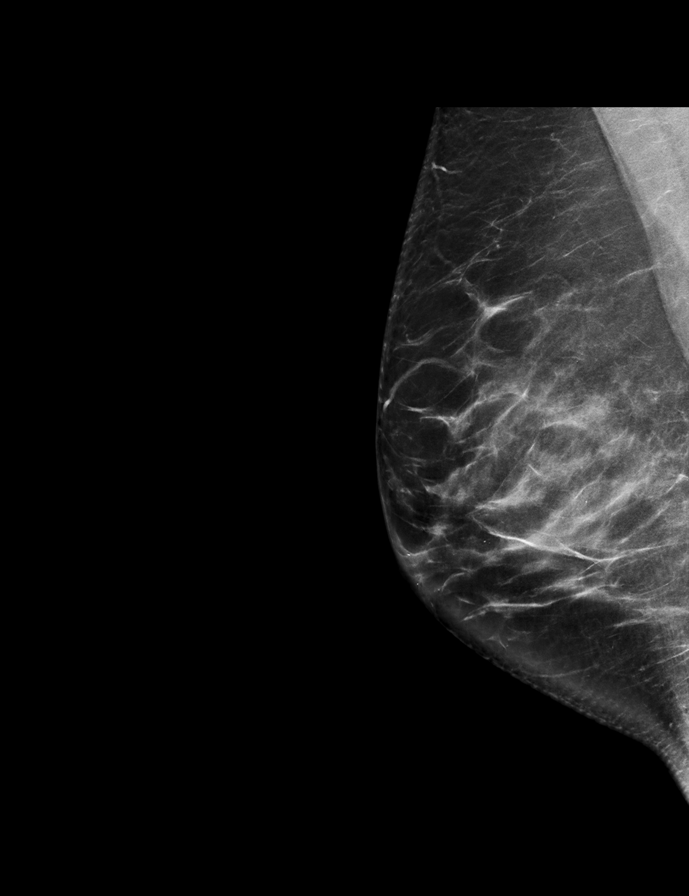

[8 of 40 positions shown; findings below may reference images not displayed]

ACR Breast Density Category c: The breast tissue is heterogeneously
dense, which may obscure small masses.
FINDINGS: On physical exam, there is clear dark yellow-hemorrhage discharge
from the central portion of the RIGHT nipple on physical exam. I
palpate no retroareolar mass.

RIGHT breast ultrasound is performed, showing no suspicious mass or
dilated ducts in the retroareolar region of the RIGHT breast.

LEFT breast ultrasound is performed, showing normal appearing breast
tissue. No suspicious mass identified.

Mammography: There are calcifications in the retroareolar region of
the RIGHT breast, further evaluated with magnified views. On
magnified views, punctate calcifications are identified in linear
orientation in the immediate retroareolar region of the RIGHT
breast. The area of concern measures 1.6 x 2.6 x 1.7 centimeters.

Mammographic images were processed with CAD.
IMPRESSION: 1. Spontaneous discharge from the RIGHT breast, confirmed on exam.
2. Indeterminate calcifications in the retroareolar region of the
RIGHT breast for which biopsy is indicated.

RECOMMENDATION:
Stereotactic guided core biopsy of RIGHT breast calcifications.

If biopsy is benign, consider surgical referral and MRI for
spontaneous RIGHT nipple discharge.

I have discussed the findings and recommendations with the patient.
Results were also provided in writing at the conclusion of the
visit. If applicable, a reminder letter will be sent to the patient
regarding the next appointment.

BI-RADS CATEGORY  4: Suspicious.

## 2019-04-11 ENCOUNTER — Ambulatory Visit: Payer: BLUE CROSS/BLUE SHIELD | Admitting: Family Medicine

## 2019-04-11 NOTE — Progress Notes (Deleted)
       Patient: Katrina Bryant Female    DOB: 09/15/1995   23 y.o.   MRN: [id number] Visit Date: 04/11/2019  Today's Provider: Lavon Paganini, MD   No chief complaint on file.  Subjective:    HPI Anxiety & Depression Patient presents today for anxiety & depression follow-up. Last office visit was 08/18/2018. Patient was advised to increase Zoloft to 100 MG daily.   Allergies  Allergen Reactions  . Cephalosporins   . Amoxicillin Rash  . Bactrim [Sulfamethoxazole-Trimethoprim] Rash  . Biaxin [Clarithromycin] Rash  . Clindamycin/Lincomycin Rash  . Penicillins Rash     Current Outpatient Medications:  .  ondansetron (ZOFRAN) 4 MG tablet, Take 1 tablet (4 mg total) by mouth every 8 (eight) hours as needed for nausea or vomiting., Disp: 20 tablet, Rfl: 2 .  sertraline (ZOLOFT) 50 MG tablet, Take 2 tablets (100 mg total) by mouth at bedtime., Disp: 60 tablet, Rfl: 1  Review of Systems  Constitutional: Negative.   Respiratory: Negative.   Genitourinary: Negative.   Neurological: Negative.     Social History   Tobacco Use  . Smoking status: Never Smoker  . Smokeless tobacco: Never Used  Substance Use Topics  . Alcohol use: Never    Frequency: Never      Objective:   There were no vitals taken for this visit. There were no vitals filed for this visit.   Physical Exam   No results found for any visits on 04/11/19.     Assessment & Plan        Lavon Paganini, MD  [facility]

## 2019-04-12 ENCOUNTER — Ambulatory Visit (INDEPENDENT_AMBULATORY_CARE_PROVIDER_SITE_OTHER): Payer: BC Managed Care – PPO | Admitting: Family Medicine

## 2019-04-12 ENCOUNTER — Other Ambulatory Visit: Payer: Self-pay

## 2019-04-12 ENCOUNTER — Encounter: Payer: Self-pay | Admitting: Family Medicine

## 2019-04-12 VITALS — BP 136/87 | HR 76 | Temp 98.3°F | Wt 173.8 lb

## 2019-04-12 DIAGNOSIS — M222X2 Patellofemoral disorders, left knee: Secondary | ICD-10-CM

## 2019-04-12 DIAGNOSIS — F321 Major depressive disorder, single episode, moderate: Secondary | ICD-10-CM | POA: Diagnosis not present

## 2019-04-12 DIAGNOSIS — R945 Abnormal results of liver function studies: Secondary | ICD-10-CM | POA: Diagnosis not present

## 2019-04-12 DIAGNOSIS — F411 Generalized anxiety disorder: Secondary | ICD-10-CM

## 2019-04-12 DIAGNOSIS — R7989 Other specified abnormal findings of blood chemistry: Secondary | ICD-10-CM

## 2019-04-12 DIAGNOSIS — N912 Amenorrhea, unspecified: Secondary | ICD-10-CM

## 2019-04-12 MED ORDER — SERTRALINE HCL 50 MG PO TABS: 100.0000 mg | ORAL_TABLET | Freq: Every day | ORAL | 3 refills | Status: DC

## 2019-04-12 NOTE — Progress Notes (Signed)
Patient: Katrina Bryant Female    DOB: 1995-12-07   23 y.o.   MRN: [id number] Visit Date: 04/12/2019  Today's Provider: Lavon Paganini, MD   Chief Complaint  Patient presents with  . Anxiety  . Depression   Subjective:    HPI  Anxiety & Depression Patient presents today for anxiety & depression follow-up. Patient last office visit was 08/16/2018. Patient had to agreed to increase her Zoloft to 100 MG and was given a small amount of Zofran to use as needed for nausea. Patient states that she was not able to increase her medication due to financial reasons.   Has been without Zoloft since 10/2018.  Anxiety and depression symptoms have worsened since that time Some passive SI intermittently since that time. No active plan Had been doing fairly well when taking the Zoloft Work performance has decreased. Concentration is poor.  Feeling bad about herself.   LMP ~4 months ago in 11/2018.  Not sexually active since 07/2018.  She previously had normal menstrual cycles.  Left knee pain: Patient fell and landed on her left kneecap about 2 months ago.  She initially had significant tenderness to touch over her left kneecap, but this is resolved.  She is now experiencing some pain under her kneecap with terminal extension and some feelings that the patella is not sitting right on the knee.  This is worsened by activity and bending/squatting.  She has not tried any treatment for this.  She has no history of knee pain.    GAD 7 : Generalized Anxiety Score 04/12/2019 08/16/2018 07/19/2018 05/24/2018  Nervous, Anxious, on Edge 3 3 3 3   Control/stop worrying 3 3 3 3   Worry too much - different things 3 3 2 3   Trouble relaxing 3 3 2 2   Restless 3 1 2 1   Easily annoyed or irritable 3 3 1 3   Afraid - awful might happen 3 3 1 3   Total GAD 7 Score 21 19 14 18   Anxiety Difficulty Extremely difficult Very difficult Very difficult Very difficult   Depression screen [facility] 2/9 04/12/2019  08/16/2018 07/19/2018 05/24/2018 03/16/2018  Decreased Interest 3 1 1 2 2   Down, Depressed, Hopeless 3 2 2 3 3   PHQ - 2 Score 6 3 3 5 5   Altered sleeping 2 3 2 3 2   Tired, decreased energy 3 2 1 2 3   Change in appetite 1 2 1 3 3   Feeling bad or failure about yourself  2 2 2 3 2   Trouble concentrating 2 1 1 2 1   Moving slowly or fidgety/restless 1 1 1 1  0  Suicidal thoughts 1 2 2 1 1   PHQ-9 Score 18 16 13 20 17   Difficult doing work/chores Very difficult Very difficult Very difficult Very difficult Very difficult   Allergies  Allergen Reactions  . Cephalosporins   . Amoxicillin Rash  . Bactrim [Sulfamethoxazole-Trimethoprim] Rash  . Biaxin [Clarithromycin] Rash  . Clindamycin/Lincomycin Rash  . Penicillins Rash     Current Outpatient Medications:  .  ondansetron (ZOFRAN) 4 MG tablet, Take 1 tablet (4 mg total) by mouth every 8 (eight) hours as needed for nausea or vomiting., Disp: 20 tablet, Rfl: 2 .  sertraline (ZOLOFT) 50 MG tablet, Take 2 tablets (100 mg total) by mouth at bedtime., Disp: 60 tablet, Rfl: 1  Review of Systems  Constitutional: Negative.   Respiratory: Negative.   Genitourinary: Negative.   Neurological: Negative.     Social History  Tobacco Use  . Smoking status: Never Smoker  . Smokeless tobacco: Never Used  Substance Use Topics  . Alcohol use: Never    Frequency: Never      Objective:   BP 136/87 (BP Location: Left Arm, Patient Position: Sitting, Cuff Size: Normal)   Pulse 76   Temp 98.3 F (36.8 C) (Oral)   Wt 173 lb 12.8 oz (78.8 kg)   LMP 12/13/2018   SpO2 98%   BMI 29.83 kg/m  Vitals:   04/12/19 1616  BP: 136/87  Pulse: 76  Temp: 98.3 F (36.8 C)  TempSrc: Oral  SpO2: 98%  Weight: 173 lb 12.8 oz (78.8 kg)     Physical Exam Vitals signs reviewed.  Constitutional:      General: She is not in acute distress.    Appearance: She is well-developed.  HENT:     Head: Normocephalic and atraumatic.  Eyes:     General: No scleral  icterus.    Conjunctiva/sclera: Conjunctivae normal.  Neck:     Musculoskeletal: Neck supple.     Comments: No thyromegaly Cardiovascular:     Rate and Rhythm: Normal rate and regular rhythm.     Pulses: Normal pulses.     Heart sounds: Normal heart sounds. No murmur.  Pulmonary:     Effort: Pulmonary effort is normal. No respiratory distress.     Breath sounds: Normal breath sounds. No wheezing.  Abdominal:     General: There is no distension.     Palpations: Abdomen is soft.     Tenderness: There is no abdominal tenderness.  Musculoskeletal:     Right lower leg: No edema.     Left lower leg: No edema.     Comments: Left knee: Normal to inspection with no erythema, swelling, deformity.  Range of motion is intact.  She does have some pain at terminal extension.  Painful patellar compression.  No joint line tenderness.  Ligaments with solid endpoints  Lymphadenopathy:     Cervical: No cervical adenopathy.  Skin:    General: Skin is warm and dry.     Capillary Refill: Capillary refill takes less than 2 seconds.     Findings: No rash.  Neurological:     Mental Status: She is alert and oriented to person, place, and time. Mental status is at baseline.  Psychiatric:        Mood and Affect: Mood is anxious and depressed. Affect is blunt.        Speech: Speech normal.        Behavior: Behavior is cooperative.        Thought Content: Thought content includes suicidal ideation. Thought content does not include homicidal ideation. Thought content does not include homicidal or suicidal plan.      No results found for any visits on 04/12/19.     Assessment & Plan   Problem List Items Addressed This Visit      Musculoskeletal and Integument   Patellofemoral pain syndrome of left knee    New problem Likely unrelated to her fall Exam and history are consistent with patellofemoral syndrome Discussed home exercise program with quadriceps strengthening, avoidance of activities that  bother it, use of brace if needed, NSAIDs, icing Discussed return precautions        Other   GAD (generalized anxiety disorder) - Primary    Chronic and uncontrolled She was previously doing well on Zoloft, but she has been off of this for about 6 months Her anxiety and  depression symptoms have been worsening since stopping her medication She is not currently doing therapy, but I did encourage and discuss the synergistic effect of therapy with medication She is contracted for safety for her rare passive SI with no plan We will resume Zoloft 50 mg daily for 1 week and then increase to 100 mg daily We discussed potential side effects Discussed it can take 6 to 8 weeks to reach full efficacy As visits are cost prohibitive at this time, we will hold off on follow-up for 3 months, but discussed strict return precautions      Relevant Medications   sertraline (ZOLOFT) 50 MG tablet   Depression, major, single episode, moderate (HCC)    Chronic and uncontrolled She was previously doing well on Zoloft, but she has been off of this for about 6 months Her anxiety and depression symptoms have been worsening since stopping her medication She is not currently doing therapy, but I did encourage and discuss the synergistic effect of therapy with medication She is contracted for safety for her rare passive SI with no plan We will resume Zoloft 50 mg daily for 1 week and then increase to 100 mg daily We discussed potential side effects Discussed it can take 6 to 8 weeks to reach full efficacy As visits are cost prohibitive at this time, we will hold off on follow-up for 3 months, but discussed strict return precautions      Relevant Medications   sertraline (ZOLOFT) 50 MG tablet   Elevated LFTs    Chronically mildly elevated Thought to be likely secondary to Tylenol use in the past, but they remain elevated despite avoidance of Tylenol She does not drink any alcohol Hepatitis panel and iron panel  were normal We will recheck LFTs If they remain elevated, consider right upper quadrant ultrasound in the future      Relevant Orders   Hepatic function panel   Amenorrhea    New problem Could be related to high levels of stress As patient has not been sexually active in 9 months, it is unlikely that pregnancy is causing this She declines pregnancy test today We will check TSH Follow-up in 3 months and if persists, may need further evaluation      Relevant Orders   TSH       Return in about 3 months (around 07/13/2019) for MDD/GAD f/u.   The entirety of the information documented in the History of Present Illness, Review of Systems and Physical Exam were personally obtained by me. Portions of this information were initially documented by [facility], CMA and reviewed by me for thoroughness and accuracy.    Less Woolsey, Dionne Bucy, MD MPH [facility]

## 2019-04-12 NOTE — Assessment & Plan Note (Signed)
Chronic and uncontrolled She was previously doing well on Zoloft, but she has been off of this for about 6 months Her anxiety and depression symptoms have been worsening since stopping her medication She is not currently doing therapy, but I did encourage and discuss the synergistic effect of therapy with medication She is contracted for safety for her rare passive SI with no plan We will resume Zoloft 50 mg daily for 1 week and then increase to 100 mg daily We discussed potential side effects Discussed it can take 6 to 8 weeks to reach full efficacy As visits are cost prohibitive at this time, we will hold off on follow-up for 3 months, but discussed strict return precautions

## 2019-04-12 NOTE — Patient Instructions (Addendum)
For the first week, take 50mg  daily. Then increase to 100mg  daily.  Call me if suicidal thoughts increase or you are having problems with the medication.  We will follow-up in 3 months, but we could adjust in the meantime.  We will call you with lab results in the next few days.   Patellofemoral Pain Syndrome  Patellofemoral pain syndrome is a condition in which the tissue (cartilage) on the underside of the kneecap (patella) softens or breaks down. This causes pain in the front of the knee. The condition is also called runner's knee or chondromalacia patella. Patellofemoral pain syndrome is most common in young adults who are active in sports. The knee is the largest joint in the body. The patella covers the front of the knee and is attached to muscles above and below the knee. The underside of the patella is covered with a smooth type of cartilage (synovium). The smooth surface helps the patella to glide easily when you move your knee. Patellofemoral pain syndrome causes swelling in the joint linings and bone surfaces in the knee. What are the causes? This condition may be caused by:  Overuse of the knee.  Poor alignment of your knee joints.  Weak leg muscles.  A direct blow to your kneecap. What increases the risk? You are more likely to develop this condition if:  You do a lot of activities that can wear down your kneecap. These include: ? Running. ? Squatting. ? Climbing stairs.  You start a new physical activity or exercise program.  You wear shoes that do not fit well.  You do not have good leg strength.  You are overweight. What are the signs or symptoms? The main symptom of this condition is knee pain. This may feel like a dull, aching pain underneath your patella, in the front of your knee. There may be a popping or cracking sound when you move your knee. Pain may get worse with:  Exercise.  Climbing  stairs.  Running.  Jumping.  Squatting.  Kneeling.  Sitting for a long time.  Moving or pushing on your patella. How is this diagnosed? This condition may be diagnosed based on:  Your symptoms and medical history. You may be asked about your recent physical activities and which ones cause knee pain.  A physical exam. This may include: ? Moving your patella back and forth. ? Checking your range of knee motion. ? Having you squat or jump to see if you have pain. ? Checking the strength of your leg muscles.  Imaging tests to confirm the diagnosis. These may include an MRI of your knee. How is this treated? This condition may be treated at home with rest, ice, compression, and elevation (RICE).  Other treatments may include:  Nonsteroidal anti-inflammatory drugs (NSAIDs).  Physical therapy to stretch and strengthen your leg muscles.  Shoe inserts (orthotics) to take stress off your knee.  A knee brace or knee support.  Adhesive tapes to the skin.  Surgery to remove damaged cartilage or move the patella to a better position. This is rare. Follow these instructions at home: If you have a shoe or brace:  Wear the shoe or brace as told by your health care provider. Remove it only as told by your health care provider.  Loosen the shoe or brace if your toes tingle, become numb, or turn cold and blue.  Keep the shoe or brace clean.  If the shoe or brace is not waterproof: ? Do not let it  get wet. ? Cover it with a watertight covering when you take a bath or a shower. Managing pain, stiffness, and swelling  If directed, put ice on the painful area. ? If you have a removable shoe or brace, remove it as told by your health care provider. ? Put ice in a plastic bag. ? Place a towel between your skin and the bag. ? Leave the ice on for 20 minutes, 2-3 times a day.  Move your toes often to avoid stiffness and to lessen swelling.  Rest your knee: ? Avoid activities that  cause knee pain. ? When sitting or lying down, raise (elevate) the injured area above the level of your heart, whenever possible. General instructions  Take over-the-counter and prescription medicines only as told by your health care provider.  Use splints, braces, knee supports, or walking aids as directed by your health care provider.  Perform stretching and strengthening exercises as told by your health care provider or physical therapist.  Do not use any products that contain nicotine or tobacco, such as cigarettes and e-cigarettes. These can delay healing. If you need help quitting, ask your health care provider.  Return to your normal activities as told by your health care provider. Ask your health care provider what activities are safe for you.  Keep all follow-up visits as told by your health care provider. This is important. Contact a health care provider if:  Your symptoms get worse.  You are not improving with home care. Summary  Patellofemoral pain syndrome is a condition in which the tissue (cartilage) on the underside of the kneecap (patella) softens or breaks down.  This condition causes swelling in the joint linings and bone surfaces in the knee. This leads to pain in the front of the knee.  This condition may be treated at home with rest, ice, compression, and elevation (RICE).  Use splints, braces, knee supports, or walking aids as directed by your health care provider. This information is not intended to replace advice given to you by your health care provider. Make sure you discuss any questions you have with your health care provider. Document Released: 08/05/2009 Document Revised: 09/27/2017 Document Reviewed: 09/27/2017 Elsevier Patient Education  2020 [employer].

## 2019-04-12 NOTE — Assessment & Plan Note (Signed)
New problem Likely unrelated to her fall Exam and history are consistent with patellofemoral syndrome Discussed home exercise program with quadriceps strengthening, avoidance of activities that bother it, use of brace if needed, NSAIDs, icing Discussed return precautions

## 2019-04-12 NOTE — Assessment & Plan Note (Signed)
Chronically mildly elevated Thought to be likely secondary to Tylenol use in the past, but they remain elevated despite avoidance of Tylenol She does not drink any alcohol Hepatitis panel and iron panel were normal We will recheck LFTs If they remain elevated, consider right upper quadrant ultrasound in the future

## 2019-04-12 NOTE — Assessment & Plan Note (Signed)
New problem Could be related to high levels of stress As patient has not been sexually active in 9 months, it is unlikely that pregnancy is causing this She declines pregnancy test today We will check TSH Follow-up in 3 months and if persists, may need further evaluation

## 2019-04-13 DIAGNOSIS — R945 Abnormal results of liver function studies: Secondary | ICD-10-CM | POA: Diagnosis not present

## 2019-04-13 DIAGNOSIS — N912 Amenorrhea, unspecified: Secondary | ICD-10-CM | POA: Diagnosis not present

## 2019-04-14 LAB — TSH: TSH: 1.98 u[IU]/mL (ref 0.450–4.500)

## 2019-04-17 LAB — SPECIMEN STATUS REPORT

## 2019-04-17 LAB — HEPATIC FUNCTION PANEL
ALT: 48 IU/L — ABNORMAL HIGH (ref 0–32)
AST: 36 IU/L (ref 0–40)
Albumin: 4.8 g/dL (ref 3.9–5.0)
Alkaline Phosphatase: 72 IU/L (ref 39–117)
Bilirubin Total: <0.2 mg/dL (ref 0.0–1.2)
Bilirubin, Direct: 0.08 mg/dL (ref 0.00–0.40)
Total Protein: 7.4 g/dL (ref 6.0–8.5)

## 2019-04-18 ENCOUNTER — Telehealth: Payer: Self-pay

## 2019-04-18 DIAGNOSIS — R7989 Other specified abnormal findings of blood chemistry: Secondary | ICD-10-CM

## 2019-04-18 NOTE — Telephone Encounter (Signed)
Patient was advised and states it is fine to have the US done of the liver.

## 2019-04-18 NOTE — Telephone Encounter (Signed)
-----   Message from Virginia Crews, MD sent at 04/18/2019 11:27 AM EDT ----- Liver function tests are stable and very slightly elevated. Thyroid function is normal.  Given persistence of elevated LFTs, can consider US of the liver to further evaluate

## 2019-04-19 NOTE — Telephone Encounter (Signed)
Patient was advised and stated that someone had called her already.

## 2019-04-19 NOTE — Telephone Encounter (Signed)
[place] ordered.  She will be called to schedule

## 2019-04-21 ENCOUNTER — Ambulatory Visit
Admission: RE | Admit: 2019-04-21 | Discharge: 2019-04-21 | Disposition: A | Discharge status: Home / Self Care | Payer: BC Managed Care – PPO | Source: Ambulatory Visit | Attending: Family Medicine | Admitting: Family Medicine

## 2019-04-21 ENCOUNTER — Other Ambulatory Visit: Payer: Self-pay

## 2019-04-21 ENCOUNTER — Telehealth: Payer: Self-pay

## 2019-04-21 DIAGNOSIS — R748 Abnormal levels of other serum enzymes: Secondary | ICD-10-CM | POA: Diagnosis not present

## 2019-04-21 DIAGNOSIS — R7989 Other specified abnormal findings of blood chemistry: Secondary | ICD-10-CM

## 2019-04-21 DIAGNOSIS — R945 Abnormal results of liver function studies: Secondary | ICD-10-CM | POA: Insufficient documentation

## 2019-04-21 NOTE — Telephone Encounter (Signed)
Patient was advised.  

## 2019-04-21 NOTE — Telephone Encounter (Signed)
-----   Message from Virginia Crews, MD sent at 04/21/2019  9:42 AM EDT ----- US shows some fatty liver changes.  Recommend monitoring liver function tests at least annually.  Recommend diet low in saturated fat and regular exercise - 30 min at least 5 times per week

## 2019-07-11 ENCOUNTER — Ambulatory Visit: Payer: BC Managed Care – PPO | Admitting: Family Medicine

## 2019-07-21 ENCOUNTER — Ambulatory Visit (INDEPENDENT_AMBULATORY_CARE_PROVIDER_SITE_OTHER): Payer: BC Managed Care – PPO | Admitting: Family Medicine

## 2019-07-21 ENCOUNTER — Encounter: Payer: Self-pay | Admitting: Family Medicine

## 2019-07-21 ENCOUNTER — Other Ambulatory Visit: Payer: Self-pay

## 2019-07-21 VITALS — BP 135/86 | HR 74 | Temp 97.1°F | Wt 163.0 lb

## 2019-07-21 DIAGNOSIS — Z23 Encounter for immunization: Secondary | ICD-10-CM | POA: Diagnosis not present

## 2019-07-21 DIAGNOSIS — R1011 Right upper quadrant pain: Secondary | ICD-10-CM | POA: Diagnosis not present

## 2019-07-21 DIAGNOSIS — F411 Generalized anxiety disorder: Secondary | ICD-10-CM | POA: Diagnosis not present

## 2019-07-21 DIAGNOSIS — F321 Major depressive disorder, single episode, moderate: Secondary | ICD-10-CM

## 2019-07-21 DIAGNOSIS — K760 Fatty (change of) liver, not elsewhere classified: Secondary | ICD-10-CM | POA: Diagnosis not present

## 2019-07-21 MED ORDER — SERTRALINE HCL 50 MG PO TABS: 150.0000 mg | ORAL_TABLET | Freq: Every day | ORAL | 3 refills | Status: DC

## 2019-07-21 NOTE — Patient Instructions (Signed)
Nonalcoholic Fatty Liver Disease Diet, Adult Nonalcoholic fatty liver disease is a condition that causes fat to build up in and around the liver. The disease makes it harder for the liver to work the way that it should. Following a healthy diet can help to keep nonalcoholic fatty liver disease under control. It can also help to prevent or improve conditions that are associated with the disease, such as heart disease, diabetes, high blood pressure, and abnormal cholesterol levels. Along with regular exercise, this diet:  Promotes weight loss.  Helps to control blood sugar levels.  Helps to improve the way that the body uses insulin. What are tips for following this plan? Reading food labels Always check food labels for:  The amount of saturated fat in a food. You should limit your intake of saturated fat. Saturated fat is found in foods that come from animals, including meat and dairy products such as butter, cheese, and whole milk.  The amount of fiber in a food. You should choose high-fiber foods such as fruits, vegetables, and whole grains. Try to get 25-30 grams (g) of fiber a day.  Cooking  When cooking, use heart-healthy oils that are high in monounsaturated fats. These include olive oil, canola oil, and avocado oil.  Limit frying or deep-frying foods. Cook foods using healthy methods such as baking, boiling, steaming, and grilling instead. Meal planning  You may want to keep track of how many calories you take in. Eating the right amount of calories will help you achieve a healthy weight. Meeting with a registered dietitian can help you get started.  Limit how often you eat takeout and fast food. These foods are usually very high in fat, salt, and sugar.  Use the glycemic index (GI) to plan your meals. The index tells you how quickly a food will raise your blood sugar. Choose low-GI foods (GI less than 55). These foods take a longer time to raise blood sugar. A registered dietitian  can help you identify foods lower on the GI scale. Lifestyle  You may want to follow a Mediterranean diet. This diet includes a lot of vegetables, lean meats or fish, whole grains, fruits, and healthy oils and fats. What foods can I eat?  Fruits Bananas. Apples. Oranges. Grapes. Papaya. Mango. Pomegranate. Kiwi. Grapefruit. Cherries. Vegetables Lettuce. Spinach. Peas. Beets. Cauliflower. Cabbage. Broccoli. Carrots. Tomatoes. Squash. Eggplant. Herbs. Peppers. Onions. Cucumbers. Brussels sprouts. Yams and sweet potatoes. Beans. Lentils. Grains Whole wheat or whole-grain foods, including breads, crackers, cereals, and pasta. Stone-ground whole wheat. Unsweetened oatmeal. Bulgur. Barley. Quinoa. Brown or wild rice. Corn or whole wheat flour tortillas. Meats and other proteins Lean meats. Poultry. Tofu. Seafood and shellfish. Dairy Low-fat or fat-free dairy products, such as yogurt, cottage cheese, or cheese. Beverages Water. Sugar-free drinks. Tea. Coffee. Low-fat or skim milk. Milk alternatives, such as soy or almond milk. Real fruit juice. Fats and oils Avocado. Canola or olive oil. Nuts and nut butters. Seeds. Seasonings and condiments Mustard. Relish. Low-fat, low-sugar ketchup and barbecue sauce. Low-fat or fat-free mayonnaise. Sweets and desserts Sugar-free sweets. The items listed above may not be a complete list of foods and beverages you can eat. Contact a dietitian for more information. What foods should I limit or avoid? Meats and other proteins Limit red meat to 1-2 times a week. Dairy Full-fat dairy. Fats and oils Palm oil and coconut oil. Fried foods. Other foods Processed foods. Foods that contain a lot of salt or sodium. Sweets and desserts Sweets that contain   Beverages Sweetened drinks, such as sweet tea, milkshakes, iced sweet drinks, and sodas. Alcohol. The items listed above may not be a complete list of foods and beverages you should avoid. Contact a  dietitian for more information. Where to find more information The National Institute of Diabetes and Digestive and Kidney Diseases: niddk.nih.gov Summary  Nonalcoholic fatty liver disease is a condition that causes fat to build up in and around the liver.  Following a healthy diet can help to keep nonalcoholic fatty liver disease under control. Your diet should be rich in fruits, vegetables, whole grains, and lean proteins.  Limit your intake of saturated fat. Saturated fat is found in foods that come from animals, including meat and dairy products such as butter, cheese, and whole milk.  This diet promotes weight loss, helps to control blood sugar levels, and helps to improve the way that the body uses insulin. This information is not intended to replace advice given to you by your health care provider. Make sure you discuss any questions you have with your health care provider. Document Released: 01/01/2015 Document Revised: 12/09/2018 Document Reviewed: 09/08/2018 Elsevier Patient Education  2020 Elsevier Inc.  

## 2019-07-21 NOTE — Progress Notes (Signed)
Patient: Katrina Bryant Female    DOB: 1996/06/28   23 y.o.   MRN: [id number] Visit Date: 07/21/2019  Today's Provider: Lavon Paganini, MD   Chief Complaint  Patient presents with  . Depression  . Anxiety  . Abdominal Pain    Right upper Quadrant   Subjective:     Anxiety Presents for follow-up visit. Symptoms include decreased concentration, depressed mood, excessive worry, insomnia, nausea (After taking Zoloft), nervous/anxious behavior and restlessness. Patient reports no confusion, dizziness, panic or suicidal ideas. The quality of sleep is fair.    Depression        This is a chronic problem.  The problem has been gradually improving (Pt states about a 10-20% improvement since restarting zoloft) since onset.  Associated symptoms include decreased concentration, helplessness, hopelessness, insomnia, restlessness and decreased interest.  Associated symptoms include no headaches and no suicidal ideas.  Compliance with treatment is variable.  Previous treatment provided mild relief.  Past medical history includes anxiety.   Abdominal Pain This is a recurrent problem. The problem has been waxing and waning. The pain is located in the RUQ. The quality of the pain is sharp. The abdominal pain does not radiate. Associated symptoms include nausea (After taking Zoloft). Pertinent negatives include no constipation, diarrhea, headaches or vomiting. The pain is aggravated by eating. She has tried nothing for the symptoms.  Seems to be more often than it was, for last week it has been daily and intermittent. Can occur after eating or just randomly. WOke her from sleep once in the last week   Depression screen [facility] 2/9 07/21/2019 04/12/2019 08/16/2018 07/19/2018 05/24/2018  Decreased Interest 1 3 1 1 2   Down, Depressed, Hopeless 3 3 2 2 3   PHQ - 2 Score 4 6 3 3 5   Altered sleeping 1 2 3 2 3   Tired, decreased energy 2 3 2 1 2   Change in appetite 1 1 2 1 3   Feeling bad or failure  about yourself  3 2 2 2 3   Trouble concentrating 3 2 1 1 2   Moving slowly or fidgety/restless 1 1 1 1 1   Suicidal thoughts 2 1 2 2 1   PHQ-9 Score 17 18 16 13 20   Difficult doing work/chores Very difficult Very difficult Very difficult Very difficult Very difficult   GAD 7 : Generalized Anxiety Score 07/21/2019 04/12/2019 08/16/2018 07/19/2018  Nervous, Anxious, on Edge 3 3 3 3   Control/stop worrying 3 3 3 3   Worry too much - different things 3 3 3 2   Trouble relaxing 2 3 3 2   Restless 1 3 1 2   Easily annoyed or irritable 1 3 3 1   Afraid - awful might happen 1 3 3 1   Total GAD 7 Score 14 21 19 14   Anxiety Difficulty Very difficult Extremely difficult Very difficult Very difficult     Allergies  Allergen Reactions  . Cephalosporins   . Amoxicillin Rash  . Bactrim [Sulfamethoxazole-Trimethoprim] Rash  . Biaxin [Clarithromycin] Rash  . Clindamycin/Lincomycin Rash  . Penicillins Rash     Current Outpatient Medications:  .  ondansetron (ZOFRAN) 4 MG tablet, Take 1 tablet (4 mg total) by mouth every 8 (eight) hours as needed for nausea or vomiting., Disp: 20 tablet, Rfl: 2 .  sertraline (ZOLOFT) 50 MG tablet, Take 3 tablets (150 mg total) by mouth at bedtime., Disp: 90 tablet, Rfl: 3  Review of Systems  Constitutional: Negative.   Respiratory: Negative.   Gastrointestinal: Positive  for nausea (After taking Zoloft). Negative for abdominal distention, abdominal pain, anal bleeding, blood in stool, constipation, diarrhea, rectal pain and vomiting.  Neurological: Negative for dizziness, light-headedness and headaches.  Psychiatric/Behavioral: Positive for decreased concentration, depression, dysphoric mood, self-injury and sleep disturbance. Negative for behavioral problems, confusion, hallucinations and suicidal ideas. The patient is nervous/anxious and has insomnia. The patient is not hyperactive.     Social History   Tobacco Use  . Smoking status: Never Smoker  . Smokeless  tobacco: Never Used  Substance Use Topics  . Alcohol use: Never    Frequency: Never      Objective:   BP 135/86 (BP Location: Left Arm, Patient Position: Sitting, Cuff Size: Normal)   Pulse 74   Temp (!) 97.1 F (36.2 C) (Temporal)   Wt 163 lb (73.9 kg)   BMI 27.98 kg/m  Vitals:   07/21/19 1047  BP: 135/86  Pulse: 74  Temp: (!) 97.1 F (36.2 C)  TempSrc: Temporal  Weight: 163 lb (73.9 kg)  Body mass index is 27.98 kg/m.   Physical Exam Vitals signs reviewed.  Constitutional:      General: She is not in acute distress.    Appearance: She is well-developed. She is not diaphoretic.  HENT:     Head: Normocephalic and atraumatic.  Cardiovascular:     Rate and Rhythm: Normal rate and regular rhythm.     Heart sounds: Normal heart sounds. No murmur.  Pulmonary:     Effort: Pulmonary effort is normal. No respiratory distress.     Breath sounds: Normal breath sounds. No wheezing or rhonchi.  Abdominal:     General: Abdomen is flat. Bowel sounds are normal.     Palpations: Abdomen is soft. There is no hepatomegaly or splenomegaly.     Tenderness: There is no abdominal tenderness. There is no guarding or rebound. Negative signs include Murphy's sign.  Skin:    General: Skin is warm and dry.     Findings: No rash.  Neurological:     Mental Status: She is alert and oriented to person, place, and time.  Psychiatric:        Mood and Affect: Mood normal.        Behavior: Behavior normal.      No results found for any visits on 07/21/19.     Assessment & Plan   Problem List Items Addressed This Visit      Other   GAD (generalized anxiety disorder)    Chronic and uncontrolled Tolerating Zoloft well and was previously well controlled on Zoloft Will increase dose to 150mg  daily She is contracted for safety for rare passive SI with no plan Encouraged therapy F/u in 3 months and consider further dose titration      Relevant Medications   sertraline (ZOLOFT) 50 MG  tablet   Depression, major, single episode, moderate (HCC) - Primary    Chronic and uncontrolled Tolerating Zoloft well and was previously well controlled on Zoloft Will increase dose to 150mg  daily She is contracted for safety for rare passive SI with no plan Encouraged therapy F/u in 3 months and consider further dose titration      Relevant Medications   sertraline (ZOLOFT) 50 MG tablet    Other Visit Diagnoses    RUQ pain       Relevant Orders   Ambulatory referral to Gastroenterology   Fatty liver       Relevant Orders   Ambulatory referral to Gastroenterology   Need for  influenza vaccination       Relevant Orders   Flu Vaccine QUAD 6+ mos PF IM (Fluarix Quad PF) (Completed)    - Chronic, mildly elevated LFTs, with fatty liver changes seen on RUQ [place] recently - now with RUQ pain, but benign exam - referral to GI for further eval and management   Return in about 3 months (around 10/21/2019) for MDD/GAD f/u.   The entirety of the information documented in the History of Present Illness, Review of Systems and Physical Exam were personally obtained by me. Portions of this information were initially documented by Ashley Royalty, CMA and reviewed by me for thoroughness and accuracy.    Bacigalupo, Dionne Bucy, MD MPH [facility]

## 2019-07-21 NOTE — Assessment & Plan Note (Signed)
Chronic and uncontrolled Tolerating Zoloft well and was previously well controlled on Zoloft Will increase dose to 150mg  daily She is contracted for safety for rare passive SI with no plan Encouraged therapy F/u in 3 months and consider further dose titration

## 2019-08-02 ENCOUNTER — Encounter: Payer: Self-pay | Admitting: *Deleted

## 2019-10-10 ENCOUNTER — Encounter: Payer: Self-pay | Admitting: Family Medicine

## 2019-10-11 ENCOUNTER — Ambulatory Visit: Payer: Self-pay | Admitting: Family Medicine

## 2019-10-11 NOTE — Telephone Encounter (Signed)
Please call and set up in office appt.

## 2019-10-12 ENCOUNTER — Other Ambulatory Visit (HOSPITAL_COMMUNITY)
Admission: RE | Admit: 2019-10-12 | Discharge: 2019-10-12 | Disposition: A | Discharge status: Home / Self Care | Payer: BC Managed Care – PPO | Source: Ambulatory Visit | Attending: Family Medicine | Admitting: Family Medicine

## 2019-10-12 ENCOUNTER — Ambulatory Visit (INDEPENDENT_AMBULATORY_CARE_PROVIDER_SITE_OTHER): Payer: BC Managed Care – PPO | Admitting: Family Medicine

## 2019-10-12 ENCOUNTER — Other Ambulatory Visit: Payer: Self-pay

## 2019-10-12 ENCOUNTER — Encounter: Payer: Self-pay | Admitting: Family Medicine

## 2019-10-12 VITALS — BP 135/83 | HR 98 | Temp 97.5°F | Resp 16 | Wt 165.0 lb

## 2019-10-12 DIAGNOSIS — N898 Other specified noninflammatory disorders of vagina: Secondary | ICD-10-CM

## 2019-10-12 DIAGNOSIS — Z202 Contact with and (suspected) exposure to infections with a predominantly sexual mode of transmission: Secondary | ICD-10-CM

## 2019-10-12 DIAGNOSIS — N760 Acute vaginitis: Secondary | ICD-10-CM | POA: Insufficient documentation

## 2019-10-12 DIAGNOSIS — B9689 Other specified bacterial agents as the cause of diseases classified elsewhere: Secondary | ICD-10-CM | POA: Insufficient documentation

## 2019-10-12 NOTE — Progress Notes (Signed)
Patient: Katrina Bryant Female    DOB: Sep 28, 1995   23 y.o.   MRN: [id number] Visit Date: 10/12/2019  Today's Provider: Lavon Paganini, MD   Chief Complaint  Patient presents with  . Exposure to STD   Subjective:    I [place] S. Dimas, CMA, am acting as scribe for Lavon Paganini, MD.   Exposure to STD  The patient's primary symptoms include genital itching and pelvic pain. The patient's pertinent negatives include no discharge, dyspareunia, dysuria, genital lesions or genital rash. This is a new problem. The current episode started in the past 7 days. The problem has been unchanged. The vaginal discharge was normal. Pertinent negatives include no abdominal pain, fever, genital odor, rectal pain, sore throat or urinary frequency. She has tried nothing for the symptoms.   Unprotected sex with 1 female partner 4 days ago.  She is not on any contraception.  She believes that her period should be coming soon but exact date of LMP is unknown.  She has been having vaginal discharge that is different from baseline  Allergies  Allergen Reactions  . Cephalosporins   . Amoxicillin Rash  . Bactrim [Sulfamethoxazole-Trimethoprim] Rash  . Biaxin [Clarithromycin] Rash  . Clindamycin/Lincomycin Rash  . Penicillins Rash     Current Outpatient Medications:  .  ondansetron (ZOFRAN) 4 MG tablet, Take 1 tablet (4 mg total) by mouth every 8 (eight) hours as needed for nausea or vomiting., Disp: 20 tablet, Rfl: 2 .  sertraline (ZOLOFT) 50 MG tablet, Take 3 tablets (150 mg total) by mouth at bedtime., Disp: 90 tablet, Rfl: 3  Review of Systems  Constitutional: Negative for fever.  HENT: Negative for sore throat.   Gastrointestinal: Negative for abdominal pain and rectal pain.  Genitourinary: Positive for pelvic pain. Negative for dyspareunia, dysuria and frequency.    Social History   Tobacco Use  . Smoking status: Never Smoker  . Smokeless tobacco: Never Used  Substance Use  Topics  . Alcohol use: Never      Objective:   BP 135/83 (BP Location: Left Arm, Patient Position: Sitting, Cuff Size: Normal)   Pulse 98   Temp (!) 97.5 F (36.4 C) (Temporal)   Resp 16   Wt 165 lb (74.8 kg)   LMP 09/13/2019 (Approximate)   BMI 28.32 kg/m  Vitals:   10/12/19 0830  BP: 135/83  Pulse: 98  Resp: 16  Temp: (!) 97.5 F (36.4 C)  TempSrc: Temporal  Weight: 165 lb (74.8 kg)  Body mass index is 28.32 kg/m.   Physical Exam Constitutional:      General: She is not in acute distress.    Appearance: Normal appearance. She is not diaphoretic.  Cardiovascular:     Rate and Rhythm: Normal rate and regular rhythm.  Pulmonary:     Effort: Pulmonary effort is normal. No respiratory distress.  Genitourinary:    Comments: GYN:  External genitalia within normal limits.  Vaginal mucosa pink, moist, normal rugae.  Nonfriable cervix without lesions, +discharge, no bleeding. Neurological:     Mental Status: She is alert and oriented to person, place, and time. Mental status is at baseline.      No results found for any visits on 10/12/19.     Assessment & Plan    1. Potential exposure to STD 2. Vaginal discharge - exposure was 4 days ago, so we discussed that she is outside of the window for emergency contraception - will test for GC/CT, Trichomonas,  HIV, syphilis - Advised to take a pregnancy test if next period is late - return precautions discussed  - will treat pending results - discussed HIV window and need to retest - HIV antibody (with reflex) - RPR - Cervicovaginal ancillary only   Return if symptoms worsen or fail to improve.   The entirety of the information documented in the History of Present Illness, Review of Systems and Physical Exam were personally obtained by me. Portions of this information were initially documented by Lynford Humphrey, CMA and reviewed by me for thoroughness and accuracy.    Bacigalupo, Dionne Bucy, MD MPH [facility]

## 2019-10-12 NOTE — Patient Instructions (Signed)

## 2019-10-13 ENCOUNTER — Telehealth: Payer: Self-pay

## 2019-10-13 LAB — CERVICOVAGINAL ANCILLARY ONLY
Bacterial Vaginitis (gardnerella): POSITIVE — AB
Candida Glabrata: NEGATIVE
Candida Vaginitis: NEGATIVE
Chlamydia: NEGATIVE
Comment: NEGATIVE
Comment: NEGATIVE
Comment: NEGATIVE
Comment: NEGATIVE
Comment: NEGATIVE
Comment: NORMAL
Neisseria Gonorrhea: NEGATIVE
Trichomonas: NEGATIVE

## 2019-10-13 LAB — HIV ANTIBODY (ROUTINE TESTING W REFLEX): HIV Screen 4th Generation wRfx: NONREACTIVE

## 2019-10-13 LAB — RPR: RPR Ser Ql: NONREACTIVE

## 2019-10-13 MED ORDER — METRONIDAZOLE 500 MG PO TABS: 500.0000 mg | ORAL_TABLET | Freq: Two times a day (BID) | ORAL | 0 refills | Status: DC

## 2019-10-13 NOTE — Telephone Encounter (Signed)
Pt advised.  RX sent to [facility].  

## 2019-10-13 NOTE — Telephone Encounter (Signed)
-----   Message from Virginia Crews, MD sent at 10/13/2019  1:44 PM EST ----- Negaitve for STDs. Postive for bacterial vaginosis (BV).  Treat with Flagyl 500mg  BID x7d #14r0. Ok to eRx if patient agrees

## 2019-10-27 ENCOUNTER — Ambulatory Visit: Payer: BC Managed Care – PPO | Admitting: Family Medicine

## 2019-10-30 ENCOUNTER — Ambulatory Visit: Payer: BC Managed Care – PPO | Admitting: Family Medicine

## 2019-12-04 ENCOUNTER — Ambulatory Visit: Payer: BC Managed Care – PPO | Attending: Internal Medicine

## 2019-12-04 ENCOUNTER — Other Ambulatory Visit: Payer: Self-pay

## 2019-12-04 DIAGNOSIS — Z23 Encounter for immunization: Secondary | ICD-10-CM

## 2019-12-04 NOTE — Progress Notes (Signed)
   Covid-19 Vaccination Clinic  Name:  Pranshi Vanhouten    MRN: [id number] DOB: Mar 13, 1996  12/04/2019  Ms. Wynee Nudd was observed post Covid-19 immunization for 15 minutes without incident. She was provided with Vaccine Information Sheet and instruction to access the V-Safe system.   Ms. Zamyia Smolenski was instructed to call 911 with any severe reactions post vaccine: Marland Kitchen Difficulty breathing  . Swelling of face and throat  . A fast heartbeat  . A bad rash all over body  . Dizziness and weakness   Immunizations Administered    Name Date Dose VIS Date Route   Pfizer COVID-19 Vaccine 12/04/2019  8:26 AM 0.3 mL 08/11/2019 Intramuscular   Manufacturer: [place]   Lot: [id number]   [place]: [id number]

## 2019-12-26 ENCOUNTER — Ambulatory Visit: Payer: BC Managed Care – PPO | Attending: Internal Medicine

## 2019-12-26 DIAGNOSIS — Z23 Encounter for immunization: Secondary | ICD-10-CM

## 2019-12-26 NOTE — Progress Notes (Signed)
   Covid-19 Vaccination Clinic  Name:  Katrina Bryant    MRN: [id number] DOB: February 02, 1996  12/26/2019  [name] was observed post Covid-19 immunization for 15 minutes without incident. She was provided with Vaccine Information Sheet and instruction to access the V-Safe system.   [name] was instructed to call 911 with any severe reactions post vaccine: Marland Kitchen Difficulty breathing  . Swelling of face and throat  . A fast heartbeat  . A bad rash all over body  . Dizziness and weakness   Immunizations Administered    Name Date Dose VIS Date Route   Pfizer COVID-19 Vaccine 12/26/2019  4:23 PM 0.3 mL 10/25/2018 Intramuscular   Manufacturer: [place]   Lot: [id number]   [place]: [id number]

## 2020-02-14 ENCOUNTER — Encounter: Payer: Self-pay | Admitting: Family Medicine

## 2020-03-27 ENCOUNTER — Other Ambulatory Visit: Payer: Self-pay

## 2020-03-27 ENCOUNTER — Encounter: Payer: Self-pay | Admitting: Physician Assistant

## 2020-03-27 ENCOUNTER — Other Ambulatory Visit (HOSPITAL_COMMUNITY)
Admission: RE | Admit: 2020-03-27 | Discharge: 2020-03-27 | Disposition: A | Discharge status: Home / Self Care | Payer: BC Managed Care – PPO | Source: Ambulatory Visit | Attending: Physician Assistant | Admitting: Physician Assistant

## 2020-03-27 ENCOUNTER — Ambulatory Visit (INDEPENDENT_AMBULATORY_CARE_PROVIDER_SITE_OTHER): Payer: BC Managed Care – PPO | Admitting: Physician Assistant

## 2020-03-27 VITALS — BP 122/82 | HR 97 | Temp 97.1°F | Wt 180.0 lb

## 2020-03-27 DIAGNOSIS — N3001 Acute cystitis with hematuria: Secondary | ICD-10-CM

## 2020-03-27 DIAGNOSIS — Z7251 High risk heterosexual behavior: Secondary | ICD-10-CM

## 2020-03-27 LAB — POCT URINALYSIS DIPSTICK
Bilirubin, UA: NEGATIVE
Glucose, UA: NEGATIVE
Ketones, UA: NEGATIVE
Nitrite, UA: NEGATIVE
Protein, UA: NEGATIVE
Spec Grav, UA: 1.025 (ref 1.010–1.025)
Urobilinogen, UA: 0.2 E.U./dL
pH, UA: 6.5 (ref 5.0–8.0)

## 2020-03-27 MED ORDER — NITROFURANTOIN MONOHYD MACRO 100 MG PO CAPS: 100.0000 mg | ORAL_CAPSULE | Freq: Two times a day (BID) | ORAL | 1 refills | Status: DC

## 2020-03-27 NOTE — Patient Instructions (Signed)
Urinary Tract Infection, Adult A urinary tract infection (UTI) is an infection of any part of the urinary tract. The urinary tract includes:  The kidneys.  The ureters.  The bladder.  The urethra. These organs make, store, and get rid of pee (urine) in the body. What are the causes? This is caused by germs (bacteria) in your genital area. These germs grow and cause swelling (inflammation) of your urinary tract. What increases the risk? You are more likely to develop this condition if:  You have a small, thin tube (catheter) to drain pee.  You cannot control when you pee or poop (incontinence).  You are female, and: ? You use these methods to prevent pregnancy:  A medicine that kills sperm (spermicide).  A device that blocks sperm (diaphragm). ? You have low levels of a female hormone (estrogen). ? You are pregnant.  You have genes that add to your risk.  You are sexually active.  You take antibiotic medicines.  You have trouble peeing because of: ? A prostate that is bigger than normal, if you are female. ? A blockage in the part of your body that drains pee from the bladder (urethra). ? A kidney stone. ? A nerve condition that affects your bladder (neurogenic bladder). ? Not getting enough to drink. ? Not peeing often enough.  You have other conditions, such as: ? Diabetes. ? A weak disease-fighting system (immune system). ? Sickle cell disease. ? Gout. ? Injury of the spine. What are the signs or symptoms? Symptoms of this condition include:  Needing to pee right away (urgently).  Peeing often.  Peeing small amounts often.  Pain or burning when peeing.  Blood in the pee.  Pee that smells bad or not like normal.  Trouble peeing.  Pee that is cloudy.  Fluid coming from the vagina, if you are female.  Pain in the belly or lower back. Other symptoms include:  Throwing up (vomiting).  No urge to eat.  Feeling mixed up (confused).  Being tired  and grouchy (irritable).  A fever.  Watery poop (diarrhea). How is this treated? This condition may be treated with:  Antibiotic medicine.  Other medicines.  Drinking enough water. Follow these instructions at home:  Medicines  Take over-the-counter and prescription medicines only as told by your doctor.  If you were prescribed an antibiotic medicine, take it as told by your doctor. Do not stop taking it even if you start to feel better. General instructions  Make sure you: ? Pee until your bladder is empty. ? Do not hold pee for a long time. ? Empty your bladder after sex. ? Wipe from front to back after pooping if you are a female. Use each tissue one time when you wipe.  Drink enough fluid to keep your pee pale yellow.  Keep all follow-up visits as told by your doctor. This is important. Contact a doctor if:  You do not get better after 1-2 days.  Your symptoms go away and then come back. Get help right away if:  You have very bad back pain.  You have very bad pain in your lower belly.  You have a fever.  You are sick to your stomach (nauseous).  You are throwing up. Summary  A urinary tract infection (UTI) is an infection of any part of the urinary tract.  This condition is caused by germs in your genital area.  There are many risk factors for a UTI. These include having a small, thin   tube to drain pee and not being able to control when you pee or poop.  Treatment includes antibiotic medicines for germs.  Drink enough fluid to keep your pee pale yellow. This information is not intended to replace advice given to you by your health care provider. Make sure you discuss any questions you have with your health care provider. Document Revised: 08/04/2018 Document Reviewed: 02/24/2018 Elsevier Patient Education  2020 Elsevier Inc.  

## 2020-03-27 NOTE — Progress Notes (Signed)
I,Laura E Walsh,acting as a Education administrator for [employer], PA-C.,have documented all relevant documentation on the behalf of Trinna Post, PA-C,as directed by  Trinna Post, PA-C while in the presence of Trinna Post, PA-C.  Established patient visit   Patient: Katrina Bryant   DOB: [birth year]   24 y.o. Female  MRN: [id number] Visit Date: 03/27/2020  Today's healthcare provider: Trinna Post, PA-C   Chief Complaint  Patient presents with  . Urinary Tract Infection   Subjective    HPI HPI    Urinary Tract Infection    This is a new problem.  Recent episode started in the past 7 days.  The problem has been gradually worsening since onset.  Abdominal Pain: Absent.  Back Pain: Absent.  Chills: Absent.  Cloudy malodorus urine: Absent.  Constipation: Absent.  Cramping: Present.  Diarrhea: Absent.  Discharge: Absent.  Fever: Absent.  Hematuria: Present.  Nausea: Absent.  Vomiting: Absent.       Last edited by Kizzie Furnish, CMA on 03/27/2020  2:42 PM. (History)        Patient Active Problem List   Diagnosis Date Noted  . Patellofemoral pain syndrome of left knee 04/12/2019  . Amenorrhea 04/12/2019  . History of PCR DNA positive for HSV1 07/20/2018  . Depression, major, single episode, moderate ([place]) 05/24/2018  . Keratosis pilaris 05/24/2018  . Elevated LFTs 05/24/2018  . Mastalgia in female 04/13/2018  . Hematuria 03/16/2018  . Vaginal discharge 03/16/2018  . GAD (generalized anxiety disorder) 03/16/2018  . History of colitis 03/16/2018  . Papilloma of right breast 03/16/2018  . Esophageal reflux 07/25/2013  . Constipation 07/25/2013  . Abdominal pain, periumbilical 47/65/4650   Past Medical History:  Diagnosis Date  . Allergy   . Anxiety   . Breast pain   . Colitis   . Depression   . Irregular menstrual cycle   . Pelvic pain    Social History   Tobacco Use  . Smoking status: Never Smoker  . Smokeless tobacco: Never Used  Vaping Use  .  Vaping Use: Never used  Substance Use Topics  . Alcohol use: Never  . Drug use: Never   Allergies  Allergen Reactions  . Cephalosporins   . Amoxicillin Rash  . Bactrim [Sulfamethoxazole-Trimethoprim] Rash  . Biaxin [Clarithromycin] Rash  . Clindamycin/Lincomycin Rash  . Penicillins Rash       Medications: Outpatient Medications Prior to Visit  Medication Sig  . metroNIDAZOLE (FLAGYL) 500 MG tablet Take 1 tablet (500 mg total) by mouth 2 (two) times daily.  . ondansetron (ZOFRAN) 4 MG tablet Take 1 tablet (4 mg total) by mouth every 8 (eight) hours as needed for nausea or vomiting.  . sertraline (ZOLOFT) 50 MG tablet Take 3 tablets (150 mg total) by mouth at bedtime.   No facility-administered medications prior to visit.    Review of Systems  Constitutional: Negative.   Respiratory: Negative.   Cardiovascular: Negative.   Gastrointestinal: Negative.   Genitourinary: Positive for frequency, hematuria, pelvic pain and urgency. Negative for decreased urine volume, difficulty urinating, dyspareunia, vaginal bleeding, vaginal discharge and vaginal pain.  Neurological: Negative for dizziness, light-headedness and headaches.      Objective    BP 122/82 (BP Location: Left Arm, Patient Position: Sitting, Cuff Size: Large)   Pulse 97   Temp (!) 97.1 F (36.2 C) (Temporal)   Wt 180 lb (81.6 kg)   LMP 03/11/2020   SpO2 99%  BMI 30.90 kg/m    Physical Exam Constitutional:      Appearance: Normal appearance.  Cardiovascular:     Rate and Rhythm: Normal rate and regular rhythm.     Pulses: Normal pulses.     Heart sounds: Normal heart sounds.  Pulmonary:     Effort: Pulmonary effort is normal.     Breath sounds: Normal breath sounds.  Abdominal:     General: Bowel sounds are normal.     Palpations: Abdomen is soft.     Tenderness: There is no abdominal tenderness. There is no right CVA tenderness or left CVA tenderness.  Musculoskeletal:     Right lower leg: No edema.      Left lower leg: No edema.  Skin:    General: Skin is warm and dry.  Neurological:     Mental Status: She is alert and oriented to person, place, and time. Mental status is at baseline.  Psychiatric:        Mood and Affect: Mood normal.        Behavior: Behavior normal.        Thought Content: Thought content normal.        Judgment: Judgment normal.       Results for orders placed or performed in visit on 03/27/20  POCT urinalysis dipstick  Result Value Ref Range   Color, UA     Clarity, UA     Glucose, UA Negative Negative   Bilirubin, UA Negative    Ketones, UA Negative    Spec Grav, UA 1.025 1.010 - 1.025   Blood, UA Large    pH, UA 6.5 5.0 - 8.0   Protein, UA Negative Negative   Urobilinogen, UA 0.2 0.2 or 1.0 E.U./dL   Nitrite, UA Negative    Leukocytes, UA Trace (A) Negative   Appearance     Odor      Assessment & Plan    1. Acute cystitis with hematuria  - POCT urinalysis dipstick - CULTURE, URINE COMPREHENSIVE - nitrofurantoin, macrocrystal-monohydrate, (MACROBID) 100 MG capsule; Take 1 capsule (100 mg total) by mouth 2 (two) times daily.  Dispense: 10 capsule; Refill: 1  2. High risk heterosexual behavior  - Urine cytology ancillary only    Return if symptoms worsen or fail to improve.      ITrinna Post, PA-C, have reviewed all documentation for this visit. The documentation on 03/28/20 for the exam, diagnosis, procedures, and orders are all accurate and complete.    Paulene Floor  [facility] [phone] (phone) [phone] (fax)  [place]

## 2020-03-29 LAB — URINE CYTOLOGY ANCILLARY ONLY
Chlamydia: NEGATIVE
Comment: NEGATIVE
Comment: NEGATIVE
Comment: NORMAL
Neisseria Gonorrhea: NEGATIVE
Trichomonas: NEGATIVE

## 2020-03-30 LAB — CULTURE, URINE COMPREHENSIVE

## 2020-04-01 ENCOUNTER — Encounter: Payer: Self-pay | Admitting: Physician Assistant

## 2020-05-21 ENCOUNTER — Encounter: Payer: Self-pay | Admitting: Family Medicine

## 2020-05-21 ENCOUNTER — Ambulatory Visit (INDEPENDENT_AMBULATORY_CARE_PROVIDER_SITE_OTHER): Payer: Self-pay | Admitting: Physician Assistant

## 2020-05-21 ENCOUNTER — Encounter: Payer: Self-pay | Admitting: Physician Assistant

## 2020-05-21 ENCOUNTER — Other Ambulatory Visit: Payer: Self-pay

## 2020-05-21 VITALS — BP 127/95 | HR 88 | Temp 98.1°F | Resp 16 | Wt 187.0 lb

## 2020-05-21 DIAGNOSIS — Z23 Encounter for immunization: Secondary | ICD-10-CM

## 2020-05-21 DIAGNOSIS — R1033 Periumbilical pain: Secondary | ICD-10-CM

## 2020-05-21 NOTE — Patient Instructions (Signed)

## 2020-05-21 NOTE — Progress Notes (Signed)
Established patient visit   Patient: Katrina Bryant   DOB: June 12, 1996   24 y.o. Female  MRN: [id number] Visit Date: 05/21/2020  Today's healthcare provider: Trinna Post, PA-C   No chief complaint on file.  Subjective    Abdominal Pain This is a new problem. The current episode started 1 to 4 weeks ago. The onset quality is sudden. The problem occurs daily. The problem has been gradually worsening. The pain is located in the generalized abdominal region and periumbilical region. The pain is at a severity of 7/10. The quality of the pain is sharp. The abdominal pain radiates to the back and left flank. Associated symptoms include flatus, frequency (of bowel movements) and nausea. Pertinent negatives include no anorexia, arthralgias, belching, constipation, diarrhea, dysuria, fever, headaches, hematochezia, hematuria, melena, myalgias, vomiting or weight loss. The pain is aggravated by certain positions, movement, eating and deep breathing. The pain is relieved by nothing. Treatments tried: aleve. The treatment provided mild relief. There is no history of abdominal surgery, colon cancer, Crohn's disease, gallstones, GERD, irritable bowel syndrome, pancreatitis, PUD or ulcerative colitis.    The pain is not constant however, but rather intermittent. Currently she does not have the pain.   Patient Active Problem List   Diagnosis Date Noted  . Patellofemoral pain syndrome of left knee 04/12/2019  . Amenorrhea 04/12/2019  . History of PCR DNA positive for HSV1 07/20/2018  . Depression, major, single episode, moderate ([place]) 05/24/2018  . Keratosis pilaris 05/24/2018  . Elevated LFTs 05/24/2018  . Mastalgia in female 04/13/2018  . Hematuria 03/16/2018  . Vaginal discharge 03/16/2018  . GAD (generalized anxiety disorder) 03/16/2018  . History of colitis 03/16/2018  . Papilloma of right breast 03/16/2018  . Esophageal reflux 07/25/2013  . Constipation 07/25/2013  . Abdominal  pain, periumbilical 65/10/5463   Past Medical History:  Diagnosis Date  . Allergy   . Anxiety   . Breast pain   . Colitis   . Depression   . Irregular menstrual cycle   . Pelvic pain    Allergies  Allergen Reactions  . Cephalosporins   . Amoxicillin Rash  . Bactrim [Sulfamethoxazole-Trimethoprim] Rash  . Biaxin [Clarithromycin] Rash  . Clindamycin/Lincomycin Rash  . Penicillins Rash       Medications: Outpatient Medications Prior to Visit  Medication Sig  . metroNIDAZOLE (FLAGYL) 500 MG tablet Take 1 tablet (500 mg total) by mouth 2 (two) times daily.  . nitrofurantoin, macrocrystal-monohydrate, (MACROBID) 100 MG capsule Take 1 capsule (100 mg total) by mouth 2 (two) times daily.  . ondansetron (ZOFRAN) 4 MG tablet Take 1 tablet (4 mg total) by mouth every 8 (eight) hours as needed for nausea or vomiting.  . sertraline (ZOLOFT) 50 MG tablet Take 3 tablets (150 mg total) by mouth at bedtime.   No facility-administered medications prior to visit.    Review of Systems  Constitutional: Negative for fever and weight loss.  Gastrointestinal: Positive for abdominal pain, flatus and nausea. Negative for anorexia, constipation, diarrhea, hematochezia, melena and vomiting.  Genitourinary: Positive for frequency (of bowel movements). Negative for dysuria and hematuria.  Musculoskeletal: Negative for arthralgias and myalgias.  Neurological: Negative for headaches.      Objective    There were no vitals taken for this visit.   Physical Exam Constitutional:      Appearance: Normal appearance.  Cardiovascular:     Rate and Rhythm: Normal rate and regular rhythm.     Heart sounds: Normal heart  sounds.  Pulmonary:     Effort: Pulmonary effort is normal.     Breath sounds: Normal breath sounds.  Abdominal:     General: Bowel sounds are normal.     Palpations: Abdomen is soft.     Tenderness: There is no abdominal tenderness. There is no right CVA tenderness or left CVA  tenderness.  Skin:    General: Skin is warm and dry.  Neurological:     Mental Status: She is alert and oriented to person, place, and time. Mental status is at baseline.  Psychiatric:        Mood and Affect: Mood normal.        Behavior: Behavior normal.       No results found for any visits on 05/21/20.  Assessment & Plan    1. Periumbilical abdominal pain  Patient is self pay currently so we will defer vaginal swab. Her STI testing several months ago was negative. Explained to patient differential is wide including but not limited to constipation, indigestion. UTI, pyelo, STI, ovarian cyst, colitis. Will evaluate as below initially. Her exam is benign today.   - CBC with Differential - Comprehensive Metabolic Panel (CMET) - Pregnancy, urine - Urinalysis  2. Need for influenza vaccination  - Flu Vaccine QUAD 36+ mos IM    No follow-ups on file.      ITrinna Post, PA-C, have reviewed all documentation for this visit. The documentation on 05/30/20 for the exam, diagnosis, procedures, and orders are all accurate and complete.  The entirety of the information documented in the History of Present Illness, Review of Systems and Physical Exam were personally obtained by me. Portions of this information were initially documented by [facility] and reviewed by me for thoroughness and accuracy.   I spent 20 minutes dedicated to the care of this patient on the date of this encounter to include pre-visit review of records, face-to-face time with the patient discussing abdominal pain, and post visit ordering of testing.    Paulene Floor  [facility] [phone] (phone) [phone] (fax)  [place]

## 2020-05-22 LAB — COMPREHENSIVE METABOLIC PANEL
ALT: 80 IU/L — ABNORMAL HIGH (ref 0–32)
AST: 49 IU/L — ABNORMAL HIGH (ref 0–40)
Albumin/Globulin Ratio: 1.6 (ref 1.2–2.2)
Albumin: 4.8 g/dL (ref 3.9–5.0)
Alkaline Phosphatase: 78 IU/L (ref 44–121)
BUN/Creatinine Ratio: 18 (ref 9–23)
BUN: 10 mg/dL (ref 6–20)
Bilirubin Total: 0.8 mg/dL (ref 0.0–1.2)
CO2: 22 mmol/L (ref 20–29)
Calcium: 10 mg/dL (ref 8.7–10.2)
Chloride: 102 mmol/L (ref 96–106)
Creatinine, Ser: 0.57 mg/dL (ref 0.57–1.00)
GFR calc Af Amer: 151 mL/min/{1.73_m2} (ref >59)
GFR calc non Af Amer: 131 mL/min/{1.73_m2} (ref >59)
Globulin, Total: 3 g/dL (ref 1.5–4.5)
Glucose: 78 mg/dL (ref 65–99)
Potassium: 3.9 mmol/L (ref 3.5–5.2)
Sodium: 140 mmol/L (ref 134–144)
Total Protein: 7.8 g/dL (ref 6.0–8.5)

## 2020-05-22 LAB — CBC WITH DIFFERENTIAL/PLATELET
Basophils Absolute: 0.1 10*3/uL (ref 0.0–0.2)
Basos: 1 %
EOS (ABSOLUTE): 0.2 10*3/uL (ref 0.0–0.4)
Eos: 2 %
Hematocrit: 41 % (ref 34.0–46.6)
Hemoglobin: 13.6 g/dL (ref 11.1–15.9)
Immature Grans (Abs): 0.1 10*3/uL (ref 0.0–0.1)
Immature Granulocytes: 1 %
Lymphocytes Absolute: 3.3 10*3/uL — ABNORMAL HIGH (ref 0.7–3.1)
Lymphs: 35 %
MCH: 30 pg (ref 26.6–33.0)
MCHC: 33.2 g/dL (ref 31.5–35.7)
MCV: 91 fL (ref 79–97)
Monocytes Absolute: 0.9 10*3/uL (ref 0.1–0.9)
Monocytes: 9 %
Neutrophils Absolute: 4.9 10*3/uL (ref 1.4–7.0)
Neutrophils: 52 %
Platelets: 348 10*3/uL (ref 150–450)
RBC: 4.53 x10E6/uL (ref 3.77–5.28)
RDW: 12.3 % (ref 11.7–15.4)
WBC: 9.4 10*3/uL (ref 3.4–10.8)

## 2020-05-22 LAB — URINALYSIS
Bilirubin, UA: NEGATIVE
Glucose, UA: NEGATIVE
Ketones, UA: NEGATIVE
Nitrite, UA: NEGATIVE
Protein,UA: NEGATIVE
RBC, UA: NEGATIVE
Specific Gravity, UA: 1.022 (ref 1.005–1.030)
Urobilinogen, Ur: 0.2 mg/dL (ref 0.2–1.0)
pH, UA: 6.5 (ref 5.0–7.5)

## 2020-05-22 LAB — PREGNANCY, URINE: Preg Test, Ur: NEGATIVE

## 2020-05-30 ENCOUNTER — Encounter: Payer: Self-pay | Admitting: Physician Assistant

## 2020-05-30 DIAGNOSIS — R109 Unspecified abdominal pain: Secondary | ICD-10-CM

## 2020-05-31 ENCOUNTER — Telehealth: Payer: Self-pay | Admitting: Family Medicine

## 2020-05-31 DIAGNOSIS — R109 Unspecified abdominal pain: Secondary | ICD-10-CM

## 2020-05-31 NOTE — Telephone Encounter (Signed)
Pt's insurance company would like to speak peer to peer to get CT of abd/pelvis approved.Phone [phone]. Member # [id number]

## 2020-05-31 NOTE — Telephone Encounter (Signed)
Please review. Thanks!  

## 2020-05-31 NOTE — Telephone Encounter (Signed)
Patient was advised and states she will have x-ray done when she go for her CT scan.

## 2020-05-31 NOTE — Telephone Encounter (Signed)
Authorization for CT [id number]. Let patient know it's been authorized and she should be contacted shortly for scheduling. Since she has insurance now I would recommend potentially getting an abdominal xray prior to the CT to see if we can assess for any constipation with a simpler test. Can also try pepcid 30 min before meal once daily to cover for heartburn.

## 2020-06-21 ENCOUNTER — Other Ambulatory Visit: Payer: Self-pay

## 2020-06-21 ENCOUNTER — Ambulatory Visit
Admission: RE | Admit: 2020-06-21 | Discharge: 2020-06-21 | Disposition: A | Discharge status: Home / Self Care | Payer: BC Managed Care – PPO | Source: Ambulatory Visit | Attending: Physician Assistant | Admitting: Physician Assistant

## 2020-06-21 DIAGNOSIS — R109 Unspecified abdominal pain: Secondary | ICD-10-CM | POA: Diagnosis not present

## 2020-06-21 DIAGNOSIS — R1084 Generalized abdominal pain: Secondary | ICD-10-CM | POA: Diagnosis not present

## 2020-06-21 DIAGNOSIS — K760 Fatty (change of) liver, not elsewhere classified: Secondary | ICD-10-CM | POA: Diagnosis not present

## 2020-07-31 ENCOUNTER — Telehealth (INDEPENDENT_AMBULATORY_CARE_PROVIDER_SITE_OTHER): Payer: BC Managed Care – PPO | Admitting: Physician Assistant

## 2020-07-31 DIAGNOSIS — J069 Acute upper respiratory infection, unspecified: Secondary | ICD-10-CM | POA: Diagnosis not present

## 2020-07-31 MED ORDER — PROMETHAZINE-DM 6.25-15 MG/5ML PO SYRP: 5.0000 mL | ORAL_SOLUTION | Freq: Every evening | ORAL | 0 refills | Status: DC | PRN

## 2020-07-31 NOTE — Progress Notes (Signed)
MyChart Video Visit    Virtual Visit via Video Note   This visit type was conducted due to national recommendations for restrictions regarding the COVID-19 Pandemic (e.g. social distancing) in an effort to limit this patient's exposure and mitigate transmission in our community. This patient is at least at moderate risk for complications without adequate follow up. This format is felt to be most appropriate for this patient at this time. Physical exam was limited by quality of the video and audio technology used for the visit.   Patient location: Home Provider location: Office   I discussed the limitations of evaluation and management by telemedicine and the availability of in person appointments. The patient expressed understanding and agreed to proceed.  Patient: Katrina Bryant   DOB: 1996-08-30   24 y.o. Female  MRN: [id number] Visit Date: 07/31/2020  Today's healthcare provider: Trinna Post, PA-C   Chief Complaint  Patient presents with  . Cough  I,Kasondra Junod M Maryfrances Portugal,acting as a scribe for Trinna Post, PA-C.,have documented all relevant documentation on the behalf of Trinna Post, PA-C,as directed by  Trinna Post, PA-C while in the presence of Trinna Post, PA-C.  Subjective    Cough This is a new problem. The current episode started yesterday. The problem has been gradually worsening. The problem occurs constantly. The cough is non-productive. Associated symptoms include chills, ear pain, headaches, nasal congestion and a sore throat. Pertinent negatives include no chest pain, ear congestion, fever, postnasal drip, rhinorrhea, shortness of breath or wheezing. She has tried OTC cough suppressant (Dayquil & Nyquil) for the symptoms. The treatment provided mild relief.  Patient reports body aches. She has been vaccinated against COVID and flu.      Medications: Outpatient Medications Prior to Visit  Medication Sig  . sertraline (ZOLOFT) 50 MG tablet  Take 3 tablets (150 mg total) by mouth at bedtime. (Patient not taking: Reported on 05/21/2020)   No facility-administered medications prior to visit.    Review of Systems  Constitutional: Positive for chills and fatigue. Negative for fever.  HENT: Positive for ear pain, sinus pressure, sneezing and sore throat. Negative for congestion, postnasal drip, rhinorrhea and sinus pain.   Respiratory: Positive for cough. Negative for chest tightness, shortness of breath and wheezing.   Cardiovascular: Negative for chest pain.  Neurological: Positive for weakness and headaches.      Objective    There were no vitals taken for this visit.   Physical Exam Constitutional:      Appearance: Normal appearance.  Pulmonary:     Effort: Pulmonary effort is normal. No respiratory distress.  Neurological:     Mental Status: She is alert.  Psychiatric:        Mood and Affect: Mood normal.        Behavior: Behavior normal.        Assessment & Plan    1. Viral URI with cough  - symptoms and exam c/w viral URI  - no evidence of strep pharyngitis, CAP, AOM, bacterial sinusitis, or other bacterial infection - concern for possible COVID19 infection - will send for outpatient testing - discussed need to quarantine 10 days from start of symptoms and until fever-free for at least 48 hours - discussed need to quarantine household members - discussed symptomatic management, natural course, and return precautions  - promethazine-dextromethorphan (PROMETHAZINE-DM) 6.25-15 MG/5ML syrup; Take 5 mLs by mouth at bedtime as needed.  Dispense: 118 mL; Refill: 0 - COVID-19, Flu A+B  and RSV    No follow-ups on file.     I discussed the assessment and treatment plan with the patient. The patient was provided an opportunity to ask questions and all were answered. The patient agreed with the plan and demonstrated an understanding of the instructions.   The patient was advised to call back or seek an  in-person evaluation if the symptoms worsen or if the condition fails to improve as anticipated.  ITrinna Post, PA-C, have reviewed all documentation for this visit. The documentation on 08/01/20 for the exam, diagnosis, procedures, and orders are all accurate and complete. The entirety of the information documented in the History of Present Illness, Review of Systems and Physical Exam were personally obtained by me. Portions of this information were initially documented by [facility] and reviewed by me for thoroughness and accuracy.    Paulene Floor [facility] [phone] (phone) [phone] (fax)  [place]

## 2020-08-02 LAB — COVID-19, FLU A+B AND RSV
Influenza A, NAA: NOT DETECTED
Influenza B, NAA: NOT DETECTED
RSV, NAA: NOT DETECTED
SARS-CoV-2, NAA: NOT DETECTED

## 2020-08-02 LAB — SPECIMEN STATUS REPORT

## 2020-08-05 ENCOUNTER — Telehealth (INDEPENDENT_AMBULATORY_CARE_PROVIDER_SITE_OTHER): Payer: BC Managed Care – PPO | Admitting: Physician Assistant

## 2020-08-05 ENCOUNTER — Encounter: Payer: Self-pay | Admitting: Physician Assistant

## 2020-08-05 DIAGNOSIS — J0110 Acute frontal sinusitis, unspecified: Secondary | ICD-10-CM | POA: Diagnosis not present

## 2020-08-05 DIAGNOSIS — J069 Acute upper respiratory infection, unspecified: Secondary | ICD-10-CM

## 2020-08-05 MED ORDER — DOXYCYCLINE HYCLATE 100 MG PO TABS: 100.0000 mg | ORAL_TABLET | Freq: Two times a day (BID) | ORAL | 0 refills | Status: DC

## 2020-08-05 MED ORDER — PROMETHAZINE-DM 6.25-15 MG/5ML PO SYRP: 5.0000 mL | ORAL_SOLUTION | Freq: Every evening | ORAL | 0 refills | Status: DC | PRN

## 2020-08-05 NOTE — Progress Notes (Signed)
MyChart Video Visit    Virtual Visit via Video Note   This visit type was conducted due to national recommendations for restrictions regarding the COVID-19 Pandemic (e.g. social distancing) in an effort to limit this patient's exposure and mitigate transmission in our community. This patient is at least at moderate risk for complications without adequate follow up. This format is felt to be most appropriate for this patient at this time. Physical exam was limited by quality of the video and audio technology used for the visit.   Patient location: Home Provider location: Office   I discussed the limitations of evaluation and management by telemedicine and the availability of in person appointments. The patient expressed understanding and agreed to proceed.  Patient: Katrina Bryant   DOB: Jul 20, 1996   24 y.o. Female  MRN: [id number] Visit Date: 08/05/2020  Today's healthcare provider: Trinna Post, PA-C   Chief Complaint  Patient presents with  . URI  I,Salam Chesterfield M Lashayla Armes,acting as a scribe for [employer], PA-C.,have documented all relevant documentation on the behalf of Trinna Post, PA-C,as directed by  Trinna Post, PA-C while in the presence of Trinna Post, PA-C.  Subjective    URI  This is a recurrent problem. The current episode started in the past 7 days. The problem has been gradually worsening. Associated symptoms include congestion, coughing, ear pain, headaches, rhinorrhea, sneezing and a sore throat. Pertinent negatives include no chest pain, diarrhea, nausea, sinus pain, vomiting or wheezing. She has tried decongestant, NSAIDs and increased fluids for the symptoms. The treatment provided mild relief.    She was treated for a URI ~7 days ago and has had steady worsening.     Medications: Outpatient Medications Prior to Visit  Medication Sig  . [DISCONTINUED] promethazine-dextromethorphan (PROMETHAZINE-DM) 6.25-15 MG/5ML syrup Take 5 mLs by  mouth at bedtime as needed.  . [DISCONTINUED] sertraline (ZOLOFT) 50 MG tablet Take 3 tablets (150 mg total) by mouth at bedtime. (Patient not taking: Reported on 05/21/2020)   No facility-administered medications prior to visit.    Review of Systems  Constitutional: Positive for chills and fatigue. Negative for fever.  HENT: Positive for congestion, ear pain, postnasal drip, rhinorrhea, sneezing, sore throat and trouble swallowing. Negative for sinus pressure and sinus pain.   Respiratory: Positive for cough. Negative for chest tightness, shortness of breath and wheezing.   Cardiovascular: Negative for chest pain.  Gastrointestinal: Negative for diarrhea, nausea and vomiting.  Neurological: Positive for weakness and headaches.      Objective    There were no vitals taken for this visit.   Physical Exam Constitutional:      Appearance: Normal appearance.  Pulmonary:     Effort: Pulmonary effort is normal. No respiratory distress.  Neurological:     Mental Status: She is alert.  Psychiatric:        Mood and Affect: Mood normal.        Behavior: Behavior normal.        Assessment & Plan     1. Acute non-recurrent frontal sinusitis  - doxycycline (VIBRA-TABS) 100 MG tablet; Take 1 tablet (100 mg total) by mouth 2 (two) times daily for 7 days.  Dispense: 14 tablet; Refill: 0  2. Viral URI with cough  - promethazine-dextromethorphan (PROMETHAZINE-DM) 6.25-15 MG/5ML syrup; Take 5 mLs by mouth at bedtime as needed.  Dispense: 118 mL; Refill: 0   No follow-ups on file.     I discussed the assessment and  treatment plan with the patient. The patient was provided an opportunity to ask questions and all were answered. The patient agreed with the plan and demonstrated an understanding of the instructions.   The patient was advised to call back or seek an in-person evaluation if the symptoms worsen or if the condition fails to improve as anticipated.   ITrinna Post,  PA-C, have reviewed all documentation for this visit. The documentation on 08/06/20 for the exam, diagnosis, procedures, and orders are all accurate and complete.  The entirety of the information documented in the History of Present Illness, Review of Systems and Physical Exam were personally obtained by me. Portions of this information were initially documented by [facility] and reviewed by me for thoroughness and accuracy.    Paulene Floor [facility] [phone] (phone) [phone] (fax)  [place]

## 2020-08-12 ENCOUNTER — Encounter: Payer: Self-pay | Admitting: Physician Assistant

## 2020-08-12 DIAGNOSIS — J0110 Acute frontal sinusitis, unspecified: Secondary | ICD-10-CM

## 2020-08-12 MED ORDER — DOXYCYCLINE HYCLATE 100 MG PO TABS: 100.0000 mg | ORAL_TABLET | Freq: Two times a day (BID) | ORAL | 0 refills | Status: AC

## 2020-08-16 ENCOUNTER — Other Ambulatory Visit: Payer: Self-pay

## 2020-08-16 ENCOUNTER — Ambulatory Visit
Admission: EM | Admit: 2020-08-16 | Discharge: 2020-08-16 | Disposition: A | Discharge status: Home / Self Care | Payer: BC Managed Care – PPO | Attending: Family Medicine | Admitting: Family Medicine

## 2020-08-16 DIAGNOSIS — M545 Low back pain, unspecified: Secondary | ICD-10-CM

## 2020-08-16 MED ORDER — MELOXICAM 15 MG PO TABS: 15.0000 mg | ORAL_TABLET | Freq: Every day | ORAL | 0 refills | Status: DC | PRN

## 2020-08-16 MED ORDER — TIZANIDINE HCL 4 MG PO TABS: 4.0000 mg | ORAL_TABLET | Freq: Three times a day (TID) | ORAL | 0 refills | Status: DC | PRN

## 2020-08-16 NOTE — Discharge Instructions (Signed)
Lots of heat.  Medication as prescribed.  Take care  Dr. Jaren Kearn  

## 2020-08-16 NOTE — ED Provider Notes (Signed)
[facility]    CSN: [id number] Arrival date & time: 08/16/20  0854      History   Chief Complaint Chief Complaint  Patient presents with   Back Pain   HPI  24 year old female presents with back pain.  Started abruptly yesterday.  Bilateral low back pain.  No radicular symptoms.  She has taken Aleve and applied some topical patches without relief.  No urinary symptoms.  Seems to be exacerbated by certain activity.  She states that she has difficulty walking upright and has difficulty straightening her back.  Pain 8/10 in severity.  No other reported symptoms.  No other complaints.  Past Medical History:  Diagnosis Date   Allergy    Anxiety    Breast pain    Colitis    Depression    Irregular menstrual cycle    Pelvic pain     Patient Active Problem List   Diagnosis Date Noted   Patellofemoral pain syndrome of left knee 04/12/2019   Amenorrhea 04/12/2019   History of PCR DNA positive for HSV1 07/20/2018   Depression, major, single episode, moderate ([place]) 05/24/2018   Keratosis pilaris 05/24/2018   Elevated LFTs 05/24/2018   Mastalgia in female 04/13/2018   Hematuria 03/16/2018   Vaginal discharge 03/16/2018   GAD (generalized anxiety disorder) 03/16/2018   History of colitis 03/16/2018   Papilloma of right breast 03/16/2018   Esophageal reflux 07/25/2013   Constipation 07/25/2013   Abdominal pain, periumbilical 43/32/9518    Past Surgical History:  Procedure Laterality Date   BREAST BIOPSY Right 03/08/2018   Affirm Bx-  X clip INTRADUCTAL PAPILLOMA.    COLONOSCOPY  03/02/2018    OB History    Gravida  0   Para  0   Term  0   Preterm  0   AB  0   Living  0     SAB  0   IAB  0   Ectopic  0   Multiple  0   Live Births  0        Obstetric Comments  Menstrual age: 24           Home Medications    Prior to Admission medications   Medication Sig Start Date End Date Taking? Authorizing Provider   doxycycline (VIBRA-TABS) 100 MG tablet Take 1 tablet (100 mg total) by mouth 2 (two) times daily for 7 days. 08/12/20 08/19/20  Trinna Post, PA-C  meloxicam (MOBIC) 15 MG tablet Take 1 tablet (15 mg total) by mouth daily as needed for pain. 08/16/20   Coral Spikes, DO  promethazine-dextromethorphan (PROMETHAZINE-DM) 6.25-15 MG/5ML syrup Take 5 mLs by mouth at bedtime as needed. 08/05/20   Trinna Post, PA-C  tiZANidine (ZANAFLEX) 4 MG tablet Take 1 tablet (4 mg total) by mouth every 8 (eight) hours as needed for muscle spasms. 08/16/20   Coral Spikes, DO    Family History Family History  Problem Relation Age of Onset   Diabetes Father    Hyperlipidemia Father    Hypertension Father    Hypertension Mother    Healthy Sister    Healthy Brother    Breast cancer Neg Hx    Ovarian cancer Neg Hx    Colon cancer Neg Hx    Cervical cancer Neg Hx     Social History Social History   Tobacco Use   Smoking status: Never Smoker   Smokeless tobacco: Never Used  Vaping Use   Vaping Use:  Some days  Substance Use Topics   Alcohol use: Never   Drug use: Never     Allergies   Cephalosporins, Amoxicillin, Bactrim [sulfamethoxazole-trimethoprim], Biaxin [clarithromycin], Clindamycin/lincomycin, and Penicillins   Review of Systems Review of Systems  Genitourinary: Negative.   Musculoskeletal: Positive for back pain.   Physical Exam Triage Vital Signs ED Triage Vitals  Enc Vitals Group     BP 08/16/20 0927 (!) 139/96     Pulse Rate 08/16/20 0927 85     Resp 08/16/20 0927 17     Temp 08/16/20 0927 98.2 F (36.8 C)     Temp Source 08/16/20 0927 Oral     SpO2 08/16/20 0927 98 %     Weight --      Height --      Head Circumference --      Peak Flow --      Pain Score 08/16/20 0926 8     Pain Loc --      Pain Edu? --      Excl. in [place]? --    Updated Vital Signs BP (!) 139/96 (BP Location: Left Arm)    Pulse 85    Temp 98.2 F (36.8 C) (Oral)    Resp 17     LMP 08/14/2020    SpO2 98%   Visual Acuity Right Eye Distance:   Left Eye Distance:   Bilateral Distance:    Right Eye Near:   Left Eye Near:    Bilateral Near:     Physical Exam Vitals and nursing note reviewed.  Constitutional:      General: She is not in acute distress.    Appearance: Normal appearance. She is not ill-appearing.  HENT:     Head: Normocephalic and atraumatic.  Eyes:     General:        Right eye: No discharge.        Left eye: No discharge.     Conjunctiva/sclera: Conjunctivae normal.  Cardiovascular:     Rate and Rhythm: Normal rate and regular rhythm.  Pulmonary:     Effort: Pulmonary effort is normal.     Breath sounds: Normal breath sounds. No wheezing, rhonchi or rales.  Musculoskeletal:     Comments: Lumbar spine -spasm noted of the paraspinal musculature of the low back.  Neurological:     Mental Status: She is alert.  Psychiatric:        Mood and Affect: Mood normal.        Behavior: Behavior normal.    UC Treatments / Results  Labs (all labs ordered are listed, but only abnormal results are displayed) Labs Reviewed - No data to display  EKG   Radiology No results found.  Procedures Procedures (including critical care time)  Medications Ordered in UC Medications - No data to display  Initial Impression / Assessment and Plan / UC Course  I have reviewed the triage vital signs and the nursing notes.  Pertinent labs & imaging results that were available during my care of the patient were reviewed by me and considered in my medical decision making (see chart for details).    24 year old female presents with low back pain.  Meloxicam and Zanaflex as directed.  Supportive care.  Work note given.  Final Clinical Impressions(s) / UC Diagnoses   Final diagnoses:  Acute bilateral low back pain without sciatica     Discharge Instructions     Lots of heat.  Medication as prescribed.  Take care  Dr.  [facility]    ED  Prescriptions    Medication Sig Dispense Auth. Provider   meloxicam (MOBIC) 15 MG tablet Take 1 tablet (15 mg total) by mouth daily as needed for pain. 30 tablet Nylee Barbuto G, DO   tiZANidine (ZANAFLEX) 4 MG tablet Take 1 tablet (4 mg total) by mouth every 8 (eight) hours as needed for muscle spasms. 30 tablet Coral Spikes, DO     PDMP not reviewed this encounter.   Coral Spikes, [place] 08/16/20 1303

## 2020-08-16 NOTE — ED Triage Notes (Signed)
Pt is here with back pain that started yesterday after sitting for 15 mins, pt has taken Aleve and applied warm compresses to the area to relieve discomfort.

## 2020-12-02 ENCOUNTER — Other Ambulatory Visit: Payer: Self-pay

## 2020-12-02 ENCOUNTER — Ambulatory Visit
Admission: EM | Admit: 2020-12-02 | Discharge: 2020-12-02 | Disposition: A | Discharge status: Home / Self Care | Payer: BC Managed Care – PPO | Attending: Family Medicine | Admitting: Family Medicine

## 2020-12-02 DIAGNOSIS — M545 Low back pain, unspecified: Secondary | ICD-10-CM

## 2020-12-02 LAB — CBC WITH DIFFERENTIAL/PLATELET
Abs Immature Granulocytes: 0.03 10*3/uL (ref 0.00–0.07)
Basophils Absolute: 0 10*3/uL (ref 0.0–0.1)
Basophils Relative: 0 %
Eosinophils Absolute: 0.1 10*3/uL (ref 0.0–0.5)
Eosinophils Relative: 1 %
HCT: 40.7 % (ref 36.0–46.0)
Hemoglobin: 13.9 g/dL (ref 12.0–15.0)
Immature Granulocytes: 0 %
Lymphocytes Relative: 39 %
Lymphs Abs: 2.7 10*3/uL (ref 0.7–4.0)
MCH: 30 pg (ref 26.0–34.0)
MCHC: 34.2 g/dL (ref 30.0–36.0)
MCV: 87.7 fL (ref 80.0–100.0)
Monocytes Absolute: 0.4 10*3/uL (ref 0.1–1.0)
Monocytes Relative: 6 %
Neutro Abs: 3.7 10*3/uL (ref 1.7–7.7)
Neutrophils Relative %: 54 %
Platelets: 305 10*3/uL (ref 150–400)
RBC: 4.64 MIL/uL (ref 3.87–5.11)
RDW: 12 % (ref 11.5–15.5)
WBC: 7 10*3/uL (ref 4.0–10.5)
nRBC: 0 % (ref 0.0–0.2)

## 2020-12-02 LAB — BASIC METABOLIC PANEL
Anion gap: 7 (ref 5–15)
BUN: 11 mg/dL (ref 6–20)
CO2: 26 mmol/L (ref 22–32)
Calcium: 9.2 mg/dL (ref 8.9–10.3)
Chloride: 103 mmol/L (ref 98–111)
Creatinine, Ser: 0.43 mg/dL — ABNORMAL LOW (ref 0.44–1.00)
GFR, Estimated: >60 mL/min (ref >60)
Glucose, Bld: 99 mg/dL (ref 70–99)
Potassium: 3.7 mmol/L (ref 3.5–5.1)
Sodium: 136 mmol/L (ref 135–145)

## 2020-12-02 LAB — URINALYSIS, COMPLETE (UACMP) WITH MICROSCOPIC
Bilirubin Urine: NEGATIVE
Glucose, UA: NEGATIVE mg/dL
Ketones, ur: NEGATIVE mg/dL
Leukocytes,Ua: NEGATIVE
Nitrite: NEGATIVE
Protein, ur: NEGATIVE mg/dL
Specific Gravity, Urine: 1.015 (ref 1.005–1.030)
pH: 6.5 (ref 5.0–8.0)

## 2020-12-02 LAB — WET PREP, GENITAL
Clue Cells Wet Prep HPF POC: NONE SEEN
Sperm: NONE SEEN
Trich, Wet Prep: NONE SEEN
WBC, Wet Prep HPF POC: NONE SEEN
Yeast Wet Prep HPF POC: NONE SEEN

## 2020-12-02 MED ORDER — METHOCARBAMOL 500 MG PO TABS: 500.0000 mg | ORAL_TABLET | Freq: Two times a day (BID) | ORAL | 0 refills | Status: DC

## 2020-12-02 MED ORDER — IBUPROFEN 600 MG PO TABS: 600.0000 mg | ORAL_TABLET | Freq: Four times a day (QID) | ORAL | 0 refills | Status: DC | PRN

## 2020-12-02 NOTE — Discharge Instructions (Addendum)
Your lab work today did not reveal the presence of any systemic infections, vaginal infections, or urine infections.  Your low back pain on exam did exhibit some musculoskeletal components.  We will treat these with anti-inflammatories and some muscle lectures.  Take the ibuprofen 600 mg every 6 hours as needed for pain and the methocarbamol 1000 mg every 6 hours as needed for spasm.  If you have any worsening of your abdominal pain, fever, nausea or vomiting, or diarrhea, return for reevaluation or go to the emergency department.

## 2020-12-02 NOTE — ED Provider Notes (Signed)
[facility]    CSN: [id number] Arrival date & time: 12/02/20  0914      History   Chief Complaint Chief Complaint  Patient presents with  . Back Pain  . Flank Pain    HPI Katrina Bryant is a 25 y.o. female.   HPI   25 year old female here for evaluation of little complaints.  Patient reports that she been experiencing low back pain for the last 2 weeks and then 24 hours ago she developed pain in her low right flank that went through to her right abdomen.  She has had some associated nausea, vaginal spotting -which she reported has resolved today, and also some vaginal burning earlier last week that has also resolved.  Patient denies fever, vomiting, vaginal discharge, pain with urination, but blood in her urine, diarrhea, constipation, vaginal discharge, and denies any history of kidney stones.  Past Medical History:  Diagnosis Date  . Allergy   . Anxiety   . Breast pain   . Colitis   . Depression   . Irregular menstrual cycle   . Pelvic pain     Patient Active Problem List   Diagnosis Date Noted  . Patellofemoral pain syndrome of left knee 04/12/2019  . Amenorrhea 04/12/2019  . History of PCR DNA positive for HSV1 07/20/2018  . Depression, major, single episode, moderate ([place]) 05/24/2018  . Keratosis pilaris 05/24/2018  . Elevated LFTs 05/24/2018  . Mastalgia in female 04/13/2018  . Hematuria 03/16/2018  . Vaginal discharge 03/16/2018  . GAD (generalized anxiety disorder) 03/16/2018  . History of colitis 03/16/2018  . Papilloma of right breast 03/16/2018  . Esophageal reflux 07/25/2013  . Constipation 07/25/2013  . Abdominal pain, periumbilical 36/62/9476    Past Surgical History:  Procedure Laterality Date  . BREAST BIOPSY Right 03/08/2018   Affirm Bx-  X clip INTRADUCTAL PAPILLOMA.   Marland Kitchen COLONOSCOPY  03/02/2018    OB History    Gravida  0   Para  0   Term  0   Preterm  0   AB  0   Living  0     SAB  0   IAB  0   Ectopic   0   Multiple  0   Live Births  0        Obstetric Comments  Menstrual age: 82           Home Medications    Prior to Admission medications   Medication Sig Start Date End Date Taking? Authorizing Provider  ibuprofen (ADVIL) 600 MG tablet Take 1 tablet (600 mg total) by mouth every 6 (six) hours as needed. 12/02/20  Yes Margarette Canada, NP  methocarbamol (ROBAXIN) 500 MG tablet Take 1 tablet (500 mg total) by mouth 2 (two) times daily. 12/02/20  Yes Margarette Canada, NP    Family History Family History  Problem Relation Age of Onset  . Diabetes Father   . Hyperlipidemia Father   . Hypertension Father   . Hypertension Mother   . Healthy Sister   . Healthy Brother   . Breast cancer Neg Hx   . Ovarian cancer Neg Hx   . Colon cancer Neg Hx   . Cervical cancer Neg Hx     Social History Social History   Tobacco Use  . Smoking status: Never Smoker  . Smokeless tobacco: Never Used  Vaping Use  . Vaping Use: Some days  Substance Use Topics  . Alcohol use: Never  . Drug use:  Never     Allergies   Cephalosporins, Amoxicillin, Bactrim [sulfamethoxazole-trimethoprim], Biaxin [clarithromycin], Clindamycin/lincomycin, and Penicillins   Review of Systems Review of Systems  Constitutional: Negative for activity change, appetite change and fever.  Gastrointestinal: Positive for abdominal pain and nausea. Negative for constipation, diarrhea and vomiting.  Genitourinary: Positive for vaginal bleeding and vaginal pain. Negative for dysuria, frequency, hematuria, urgency and vaginal discharge.  Musculoskeletal: Positive for back pain.  Skin: Negative for rash.  Hematological: Negative.   Psychiatric/Behavioral: Negative.      Physical Exam Triage Vital Signs ED Triage Vitals  Enc Vitals Group     BP 12/02/20 1041 (!) 125/99     Pulse Rate 12/02/20 1041 82     Resp 12/02/20 1041 18     Temp 12/02/20 1041 98.2 F (36.8 C)     Temp Source 12/02/20 1041 Oral     SpO2  12/02/20 1041 96 %     Weight 12/02/20 1039 195 lb (88.5 kg)     Height 12/02/20 1039 5\' 4"  (1.626 m)     Head Circumference --      Peak Flow --      Pain Score 12/02/20 1038 8     Pain Loc --      Pain Edu? --      Excl. in [place]? --    No data found.  Updated Vital Signs BP (!) 125/99 (BP Location: Right Arm)   Pulse 82   Temp 98.2 F (36.8 C) (Oral)   Resp 18   Ht 5\' 4"  (1.626 m)   Wt 195 lb (88.5 kg)   LMP 11/30/2020   SpO2 96%   BMI 33.47 kg/m   Visual Acuity Right Eye Distance:   Left Eye Distance:   Bilateral Distance:    Right Eye Near:   Left Eye Near:    Bilateral Near:     Physical Exam Vitals and nursing note reviewed.  Constitutional:      General: She is not in acute distress.    Appearance: Normal appearance. She is not ill-appearing.  HENT:     Head: Normocephalic and atraumatic.  Cardiovascular:     Rate and Rhythm: Normal rate and regular rhythm.     Pulses: Normal pulses.     Heart sounds: Normal heart sounds. No murmur heard. No gallop.   Pulmonary:     Effort: Pulmonary effort is normal.     Breath sounds: Normal breath sounds. No wheezing, rhonchi or rales.  Abdominal:     General: Bowel sounds are normal. There is no distension.     Palpations: Abdomen is soft.     Tenderness: There is no abdominal tenderness. There is no right CVA tenderness, left CVA tenderness, guarding or rebound.  Skin:    General: Skin is warm and dry.     Capillary Refill: Capillary refill takes less than 2 seconds.     Findings: No erythema or rash.  Neurological:     General: No focal deficit present.     Mental Status: She is alert and oriented to person, place, and time.  Psychiatric:        Mood and Affect: Mood normal.        Behavior: Behavior normal.        Thought Content: Thought content normal.        Judgment: Judgment normal.      UC Treatments / Results  Labs (all labs ordered are listed, but only abnormal results are displayed)  Labs  Reviewed  URINALYSIS, COMPLETE (UACMP) WITH MICROSCOPIC - Abnormal; Notable for the following components:      Result Value   Hgb urine dipstick MODERATE (*)    Bacteria, UA FEW (*)    All other components within normal limits  BASIC METABOLIC PANEL - Abnormal; Notable for the following components:   Creatinine, Ser 0.43 (*)    All other components within normal limits  WET PREP, GENITAL  URINE CULTURE  CBC WITH DIFFERENTIAL/PLATELET    EKG   Radiology No results found.  Procedures Procedures (including critical care time)  Medications Ordered in UC Medications - No data to display  Initial Impression / Assessment and Plan / UC Course  I have reviewed the triage vital signs and the nursing notes.  Pertinent labs & imaging results that were available during my care of the patient were reviewed by me and considered in my medical decision making (see chart for details).   Patient is a very pleasant 25 year old female here for evaluation of low back pain, right flank pain, and right abdominal pain.  Patient reports that she had back pain for last 2 weeks and then the flank pain abdominal pain started 24 hours ago.  She describes it as a pins and needle sensation that starts on the right and wraps around goes across her whole lower abdomen.  Patient has associated nausea and reports that she had some vaginal spotting that she reports was similar to if she was starting her menses.  She reports that the spotting lasted 2 days and has resolved today.  She states that earlier in the week she had some vaginal burning as well that was present for 1 day but has resolved.  Patient denies fever, diarrhea or constipation, or vaginal discharge.  Patient's physical exam reveals a normal cardiopulmonary exam.  Abdomen is protuberant but soft, nontender, nondistended, with positive bowel sounds in all 4 quadrants.  Patient does have some mild tenderness to palpation of the right paraspinous region of the  low back.  Urinalysis collected at triage the presence of moderate blood and few bacteria but was negative for protein, nitrates, leukocyte esterase, and only 6-10 WBCs present.  Will send urine for culture along with checking CBC, BNP, and wet prep.  CBC is unremarkable.  BMP is unremarkable.  Wet prep is negative for yeast, trichomonas, or clue cells.  Will discharge patient home and treat her low back pain with anti-inflammatories and home PT.  Patient will be given ER precautions for worsening of her abdominal pain.  Work note provided.      Final Clinical Impressions(s) / UC Diagnoses   Final diagnoses:  Acute right-sided low back pain without sciatica     Discharge Instructions     Your lab work today did not reveal the presence of any systemic infections, vaginal infections, or urine infections.  Your low back pain on exam did exhibit some musculoskeletal components.  We will treat these with anti-inflammatories and some muscle lectures.  Take the ibuprofen 600 mg every 6 hours as needed for pain and the methocarbamol 1000 mg every 6 hours as needed for spasm.  If you have any worsening of your abdominal pain, fever, nausea or vomiting, or diarrhea, return for reevaluation or go to the emergency department.    ED Prescriptions    Medication Sig Dispense Auth. Provider   ibuprofen (ADVIL) 600 MG tablet Take 1 tablet (600 mg total) by mouth every 6 (six) hours as needed. [place]  tablet Margarette Canada, NP   methocarbamol (ROBAXIN) 500 MG tablet Take 1 tablet (500 mg total) by mouth 2 (two) times daily. 20 tablet Margarette Canada, NP     PDMP not reviewed this encounter.   Margarette Canada, NP 12/02/20 1251

## 2020-12-02 NOTE — ED Triage Notes (Signed)
Patient states that she has been having low back pain x 2 weeks. States that in the last 24 hours she has noticed pain radiating through her flank and lower abdominal pain. States that pain is worse on her right flank and describes that pain as sharp.

## 2020-12-03 LAB — URINE CULTURE: Special Requests: NORMAL

## 2021-04-01 ENCOUNTER — Other Ambulatory Visit: Payer: Self-pay

## 2021-04-01 ENCOUNTER — Ambulatory Visit (INDEPENDENT_AMBULATORY_CARE_PROVIDER_SITE_OTHER): Payer: Self-pay | Admitting: Podiatry

## 2021-04-01 DIAGNOSIS — B351 Tinea unguium: Secondary | ICD-10-CM

## 2021-04-01 NOTE — Progress Notes (Signed)
e

## 2021-04-01 NOTE — Progress Notes (Signed)
   Subjective: 25 y.o. female presenting today for evaluation of discoloration and thickening to the bilateral feet that has been present for about 1 year now. She denies a history of trauma. She has tried OTC topical antifungal without any improvement. She presents for further treatment and evaluation  Past Medical History:  Diagnosis Date   Allergy    Anxiety    Breast pain    Colitis    Depression    Irregular menstrual cycle    Pelvic pain     Objective: Physical Exam General: The patient is alert and oriented x3 in no acute distress.  Dermatology: Hyperkeratotic, discolored, thickened, onychodystrophy noted to the bilateral feet. Skin is warm, dry and supple bilateral lower extremities. Negative for open lesions or macerations.  Vascular: Palpable pedal pulses bilaterally. No edema or erythema noted. Capillary refill within normal limits.  Neurological: Epicritic and protective threshold grossly intact bilaterally.   Musculoskeletal Exam: Range of motion within normal limits to all pedal and ankle joints bilateral. Muscle strength 5/5 in all groups bilateral.   Assessment: #1 Onychomycosis of toenails bilateral feet   Plan of Care:  #1 Patient was evaluated. #2  Today we discussed different treatment options including oral, topical, and laser antifungal treatment modalities.  The patient opts for oral antifungal treatment modalities.  She does state that she has a history of fatty liver however. #3 order placed for hepatic function panel.  If within normal limits we will prescribe Lamisil 250 mg #90 daily #4 return to clinic as needed   Edrick Kins, DPM [facility]  Dr. Edrick Kins, DPM    [address], [place] [address]                Office [phone]  Fax [phone]

## 2021-04-02 LAB — HEPATIC FUNCTION PANEL
ALT: 68 IU/L — ABNORMAL HIGH (ref 0–32)
AST: 40 IU/L (ref 0–40)
Albumin: 4.8 g/dL (ref 3.9–5.0)
Alkaline Phosphatase: 103 IU/L (ref 44–121)
Bilirubin Total: 0.8 mg/dL (ref 0.0–1.2)
Bilirubin, Direct: 0.2 mg/dL (ref 0.00–0.40)
Total Protein: 7.7 g/dL (ref 6.0–8.5)

## 2021-04-11 ENCOUNTER — Telehealth: Payer: Self-pay

## 2021-04-11 ENCOUNTER — Encounter: Payer: Self-pay | Admitting: Family Medicine

## 2021-04-11 ENCOUNTER — Telehealth: Payer: Self-pay | Admitting: Podiatry

## 2021-04-11 NOTE — Telephone Encounter (Signed)
Copied from [place] [id number]. Topic: Appointment Scheduling - Scheduling Inquiry for Clinic >> Apr 11, 2021  2:09 PM Celene Kras wrote: Reason for CRM: Pt called stating that she has been very nauseous with some vomiting. She states that she missed her period of July, but has had a negative pregnancy test. Pt is requesting to be seen sooner than next available with PCP. Please advise.

## 2021-04-11 NOTE — Telephone Encounter (Signed)
Patient is calling today to follow up on her results from [place]. Please call p[t at [phone].

## 2021-04-11 NOTE — Telephone Encounter (Signed)
Can repeat home pregnancy test and schedule appt

## 2021-04-14 ENCOUNTER — Ambulatory Visit: Payer: Self-pay | Admitting: Family Medicine

## 2021-04-14 NOTE — Telephone Encounter (Signed)
Katrina Bryant, Could you contact patient? Let her know that unfortunately her liver function panel was elevated. Please let her know that we cannot prescribe the Lamisil. Recommend laser and set up an appointment for laser if she is up for it. It would be monthly x 4-6 visits. Thanks, Dr. Amalia Hailey

## 2021-04-17 ENCOUNTER — Ambulatory Visit: Payer: Self-pay | Admitting: Family Medicine

## 2021-04-18 ENCOUNTER — Encounter: Payer: Self-pay | Admitting: Podiatry

## 2021-04-22 ENCOUNTER — Encounter: Payer: Self-pay | Admitting: Family Medicine

## 2021-04-22 ENCOUNTER — Ambulatory Visit: Payer: Self-pay | Admitting: Family Medicine

## 2021-04-22 ENCOUNTER — Other Ambulatory Visit: Payer: Self-pay

## 2021-04-22 VITALS — BP 136/92 | HR 70 | Temp 98.5°F | Resp 16 | Wt 196.3 lb

## 2021-04-22 DIAGNOSIS — R112 Nausea with vomiting, unspecified: Secondary | ICD-10-CM | POA: Insufficient documentation

## 2021-04-22 DIAGNOSIS — K582 Mixed irritable bowel syndrome: Secondary | ICD-10-CM | POA: Insufficient documentation

## 2021-04-22 DIAGNOSIS — R1084 Generalized abdominal pain: Secondary | ICD-10-CM

## 2021-04-22 DIAGNOSIS — R7989 Other specified abnormal findings of blood chemistry: Secondary | ICD-10-CM

## 2021-04-22 HISTORY — DX: Nausea with vomiting, unspecified: R11.2

## 2021-04-22 HISTORY — DX: Generalized abdominal pain: R10.84

## 2021-04-22 MED ORDER — ONDANSETRON 4 MG PO TBDP: 4.0000 mg | ORAL_TABLET | Freq: Three times a day (TID) | ORAL | 0 refills | Status: DC | PRN

## 2021-04-22 MED ORDER — DICYCLOMINE HCL 10 MG PO CAPS: 10.0000 mg | ORAL_CAPSULE | Freq: Three times a day (TID) | ORAL | 1 refills | Status: DC

## 2021-04-22 NOTE — Assessment & Plan Note (Signed)
Ongoing nausea Cannot pinpoint cause Only relief is BM Change in BM consistency, frequency One episode vomiting Add ODT zofran Discussed small, frequent meals

## 2021-04-22 NOTE — Assessment & Plan Note (Signed)
Generalized complaints >1 year Previous complaints of constipation Now complaints of diarrhea Start Bentyl QID Discussed FODMAPs and elimination diet to assist with identifying cause

## 2021-04-22 NOTE — Assessment & Plan Note (Signed)
Shifting areas of concern Primary concern on R/L flank Complaint of LUQ/Epigastric pain during exam; unable to reproduce once reported Describes as 'dull' cramping pain Relief is having BM Refused gastro referral at this time s/s cost

## 2021-04-22 NOTE — Progress Notes (Signed)
Established patient visit   Patient: Katrina Bryant   DOB: [birth year]   24 y.o. Female  MRN: [id number] Visit Date: 04/22/2021  Today's healthcare provider: Gwyneth Sprout, FNP   Chief Complaint  Patient presents with   Nausea   Subjective  -------------------------------------------------------------------------------------------------------------------- HPI HPI   Patient comes in office today with complaint of nausea that has been present for the past 4 weeks. Patient reports that a week ago she began having loose stools and states that whenever she does attempt to eat she immediately has to pass a bowel movement. Patient reports lower back pain, vomiting ( one episode), and fatigue associated with nausea.  Last edited by Minette Headland, CMA on 04/22/2021  9:01 AM.           Medications: Outpatient Medications Prior to Visit  Medication Sig   [DISCONTINUED] ibuprofen (ADVIL) 600 MG tablet Take 1 tablet (600 mg total) by mouth every 6 (six) hours as needed. (Patient not taking: Reported on 04/22/2021)   [DISCONTINUED] methocarbamol (ROBAXIN) 500 MG tablet Take 1 tablet (500 mg total) by mouth 2 (two) times daily. (Patient not taking: Reported on 04/22/2021)   No facility-administered medications prior to visit.    Review of Systems     Objective  -------------------------------------------------------------------------------------------------------------------- BP (!) 136/92   Pulse 70   Temp 98.5 F (36.9 C) (Oral)   Resp 16   Wt 196 lb 4.8 oz (89 kg)   LMP 04/21/2021 (Exact Date)   SpO2 98%   BMI 33.69 kg/m     Physical Exam Vitals and nursing note reviewed.  Constitutional:      General: She is not in acute distress.    Appearance: Normal appearance. She is obese. She is not ill-appearing, toxic-appearing or diaphoretic.  HENT:     Head: Normocephalic and atraumatic.  Eyes:     Extraocular Movements: Extraocular movements intact.   Cardiovascular:     Rate and Rhythm: Normal rate and regular rhythm.     Pulses: Normal pulses.     Heart sounds: Normal heart sounds. No murmur heard.   No friction rub. No gallop.  Pulmonary:     Effort: No respiratory distress.     Breath sounds: Normal breath sounds. No stridor. No wheezing, rhonchi or rales.  Chest:     Chest wall: No tenderness.  Abdominal:     General: Bowel sounds are normal. There is no distension or abdominal bruit. There are no signs of injury.     Palpations: Abdomen is soft. There is no shifting dullness, hepatomegaly, splenomegaly, mass or pulsatile mass.     Tenderness: There is abdominal tenderness in the left upper quadrant. There is no right CVA tenderness, left CVA tenderness, guarding or rebound. Negative signs include Murphy's sign, Rovsing's sign, McBurney's sign, psoas sign and obturator sign.     Hernia: No hernia is present.       Comments: Intermittent tenderness on exam in LUQ; unable to reproduce Following exam, minor nausea spell. No emesis  Musculoskeletal:        General: Normal range of motion.     Cervical back: Normal range of motion.  Skin:    General: Skin is warm and dry.     Capillary Refill: Capillary refill takes less than 2 seconds.  Neurological:     General: No focal deficit present.     Mental Status: She is alert and oriented to person, place, and time.  Psychiatric:  Mood and Affect: Mood normal.        Behavior: Behavior normal.        Thought Content: Thought content normal.        Judgment: Judgment normal.      No results found for any visits on 04/22/21.  Assessment & Plan  ---------------------------------------------------------------------------------------------------------------------- Problem List Items Addressed This Visit       Digestive   Nausea and vomiting - Primary    Ongoing nausea Cannot pinpoint cause Only relief is BM Change in BM consistency, frequency One episode vomiting Add  ODT zofran Discussed small, frequent meals      Relevant Medications   ondansetron (ZOFRAN ODT) 4 MG disintegrating tablet   Other Relevant Orders   Basic metabolic panel   Hepatic function panel   Renal function panel   CBC   Irritable bowel syndrome with both constipation and diarrhea    Generalized complaints >1 year Previous complaints of constipation Now complaints of diarrhea Start Bentyl QID Discussed FODMAPs and elimination diet to assist with identifying cause      Relevant Medications   ondansetron (ZOFRAN ODT) 4 MG disintegrating tablet   dicyclomine (BENTYL) 10 MG capsule   Other Relevant Orders   Basic metabolic panel   Hepatic function panel   Renal function panel   CBC     Other   Elevated LFTs    Previous elevated Continue to monitor Repeat labs today      Diffuse abdominal pain    Shifting areas of concern Primary concern on R/L flank Complaint of LUQ/Epigastric pain during exam; unable to reproduce once reported Describes as 'dull' cramping pain Relief is having BM Refused gastro referral at this time s/s cost      Relevant Medications   ondansetron (ZOFRAN ODT) 4 MG disintegrating tablet   Other Relevant Orders   Basic metabolic panel   Hepatic function panel   Renal function panel   CBC     Return in about 4 weeks (around 05/20/2021) for abdominal pain.    Can be video visit if needed to discuss how complaints have improved since adding medication.   Vonna Kotyk, FNP, have reviewed all documentation for this visit. The documentation on 04/22/21 for the exam, diagnosis, procedures, and orders are all accurate and complete.    Gwyneth Sprout, [place] [phone] (phone) [phone] (fax)  [place]

## 2021-04-22 NOTE — Assessment & Plan Note (Signed)
Previous elevated Continue to monitor Repeat labs today

## 2021-04-23 LAB — HEPATIC FUNCTION PANEL
ALT: 55 IU/L — ABNORMAL HIGH (ref 0–32)
AST: 33 IU/L (ref 0–40)
Alkaline Phosphatase: 96 IU/L (ref 44–121)
Bilirubin Total: 0.6 mg/dL (ref 0.0–1.2)
Bilirubin, Direct: 0.2 mg/dL (ref 0.00–0.40)
Total Protein: 7.4 g/dL (ref 6.0–8.5)

## 2021-04-23 LAB — RENAL FUNCTION PANEL
Albumin: 4.6 g/dL (ref 3.9–5.0)
BUN/Creatinine Ratio: 16 (ref 9–23)
BUN: 8 mg/dL (ref 6–20)
CO2: 19 mmol/L — ABNORMAL LOW (ref 20–29)
Calcium: 9.3 mg/dL (ref 8.7–10.2)
Chloride: 104 mmol/L (ref 96–106)
Creatinine, Ser: 0.49 mg/dL — ABNORMAL LOW (ref 0.57–1.00)
Glucose: 94 mg/dL (ref 65–99)
Phosphorus: 3.6 mg/dL (ref 3.0–4.3)
Potassium: 4.1 mmol/L (ref 3.5–5.2)
Sodium: 140 mmol/L (ref 134–144)
eGFR: 135 mL/min/{1.73_m2} (ref >59)

## 2021-04-23 LAB — CBC
Hematocrit: 40.2 % (ref 34.0–46.6)
Hemoglobin: 13.4 g/dL (ref 11.1–15.9)
MCH: 29.9 pg (ref 26.6–33.0)
MCHC: 33.3 g/dL (ref 31.5–35.7)
MCV: 90 fL (ref 79–97)
Platelets: 305 10*3/uL (ref 150–450)
RBC: 4.48 x10E6/uL (ref 3.77–5.28)
RDW: 12 % (ref 11.7–15.4)
WBC: 8.4 10*3/uL (ref 3.4–10.8)

## 2021-04-28 ENCOUNTER — Telehealth: Payer: Self-pay | Admitting: *Deleted

## 2021-04-28 NOTE — Telephone Encounter (Signed)
Called patient and gave information concerning liver function results and that she may opt to have laser, she verbalized understanding , said that she will think about it, will contact  if she choses to have this done.

## 2021-05-27 ENCOUNTER — Ambulatory Visit: Payer: Self-pay | Admitting: Family Medicine

## 2021-07-29 ENCOUNTER — Ambulatory Visit: Payer: Self-pay

## 2021-07-29 NOTE — Telephone Encounter (Signed)
Patient called, no answer, recording call could not be completed at this time, try call again later.   Message from Jodie Echevaria sent at 07/29/2021 11:09 AM EST  Patient called in to say that on 07/19/21 she started feeling sick had fever, sore throat, cough and mucus now some symptoms are gone but still coughing and is concerned because she wakes out her sleep not able to breathe properly from the mucus clogging her airways. Please call no appointment available Ph# [phone]

## 2021-07-30 NOTE — Telephone Encounter (Signed)
FYI

## 2021-07-30 NOTE — Telephone Encounter (Signed)
Pt. Started having productive cough and congestion 10 days ago. Coughing up yellow mucus. Has some ear pain as well. COVID 19 test negative. [place] for virtual visit tomorrow per [place] in the practice. Appointment made.     Reason for Disposition  [1] Continuous (nonstop) coughing interferes with work or school AND [2] no improvement using cough treatment per Care Advice  Answer Assessment - Initial Assessment Questions 1. ONSET: "When did the cough begin?"      07/19/21 2. SEVERITY: "How bad is the cough today?"      Severe 3. SPUTUM: "Describe the color of your sputum" (none, dry cough; clear, white, yellow, green)     Yellow 4. HEMOPTYSIS: "Are you coughing up any blood?" If so ask: "How much?" (flecks, streaks, tablespoons, etc.)     Specks 5. DIFFICULTY BREATHING: "Are you having difficulty breathing?" If Yes, ask: "How bad is it?" (e.g., mild, moderate, severe)    - MILD: No SOB at rest, mild SOB with walking, speaks normally in sentences, can lie down, no retractions, pulse < 100.    - MODERATE: SOB at rest, SOB with minimal exertion and prefers to sit, cannot lie down flat, speaks in phrases, mild retractions, audible wheezing, pulse 100-120.    - SEVERE: Very SOB at rest, speaks in single words, struggling to breathe, sitting hunched forward, retractions, pulse > 120      No 6. FEVER: "Do you have a fever?" If Yes, ask: "What is your temperature, how was it measured, and when did it start?"     No 7. CARDIAC HISTORY: "Do you have any history of heart disease?" (e.g., heart attack, congestive heart failure)      No 8. LUNG HISTORY: "Do you have any history of lung disease?"  (e.g., pulmonary embolus, asthma, emphysema)     No 9. PE RISK FACTORS: "Do you have a history of blood clots?" (or: recent major surgery, recent prolonged travel, bedridden)     No 10. OTHER SYMPTOMS: "Do you have any other symptoms?" (e.g., runny nose, wheezing, chest pain)       Runny nose, ear pain 11.  PREGNANCY: "Is there any chance you are pregnant?" "When was your last menstrual period?"       No 12. TRAVEL: "Have you traveled out of the country in the last month?" (e.g., travel history, exposures)       No  Protocols used: Cough - Acute Productive-A-AH

## 2021-07-30 NOTE — Progress Notes (Signed)
MyChart Video Visit    Virtual Visit via Video Note   This visit type was conducted due to national recommendations for restrictions regarding the COVID-19 Pandemic (e.g. social distancing) in an effort to limit this patient's exposure and mitigate transmission in our community. This patient is at least at moderate risk for complications without adequate follow up. This format is felt to be most appropriate for this patient at this time. Physical exam was limited by quality of the video and audio technology used for the visit.   Patient location: home Provider location: [facility]  I discussed the limitations of evaluation and management by telemedicine and the availability of in person appointments. The patient expressed understanding and agreed to proceed.  Patient: Katrina Bryant   DOB: 11/27/1995   25 y.o. Female  MRN: [id number] Visit Date: 07/31/2021  Today's healthcare provider: Mikey Kirschner, PA-C   Chief Complaint  Patient presents with   Cough    Productive; x10 days (07/19/21); home Covid test negative on 07/23/21   Nasal Congestion   Subjective    HPI HPI     Cough    Additional comments: Productive; x10 days (07/19/21); home Covid test negative on 07/23/21        Comments   Took Afrin with some relief, but has stopped      Last edited by Forbes Cellar, CMA on 07/31/2021 10:29 AM.      Katrina Bryant is a 25 y/o female who presents today with 10 days of sore throat, nasal congestion, rhinorrhea, productive cough with yellow/clear mucous. She also reports night sweats. She states she felt better over the weekend but her symptoms returned and worsened on Monday. Denies current fever, chills, wheezing, CP, hemoptysis. Reports negative COVID test. Her boyfriend's daughter is 3 and is also sick. She has been trying afrin, dayquil/nyquil.  Patient does not have new pets. Patient does not have a history of asthma. Patient does not have a history of environmental  allergens. Patient has not recent travel. Patient does not have a history of smoking. Patient  has not previous Chest X-ray.    Medications: Outpatient Medications Prior to Visit  Medication Sig   [DISCONTINUED] dicyclomine (BENTYL) 10 MG capsule Take 1 capsule (10 mg total) by mouth 4 (four) times daily -  before meals and at bedtime.   [DISCONTINUED] ondansetron (ZOFRAN ODT) 4 MG disintegrating tablet Take 1 tablet (4 mg total) by mouth every 8 (eight) hours as needed for nausea or vomiting.   No facility-administered medications prior to visit.    Review of Systems  Constitutional:  Positive for diaphoresis and fatigue. Negative for fever.  HENT:  Positive for congestion, ear pain, postnasal drip, rhinorrhea, sinus pressure, sinus pain and sore throat.   Respiratory:  Positive for cough. Negative for wheezing.   Cardiovascular:  Negative for chest pain.    Objective    Ht 5\' 4"  (1.626 m)   Wt 200 lb (90.7 kg)   LMP 07/29/2021 (Exact Date)   BMI 34.33 kg/m    Physical Exam Constitutional:      Appearance: Normal appearance. She is not ill-appearing.     Comments: Appears fatigued and sounds congested  HENT:     Nose: Congestion present.  Pulmonary:     Effort: Pulmonary effort is normal.  Neurological:     Mental Status: She is alert and oriented to person, place, and time.  Psychiatric:        Mood and Affect: Mood normal.  Behavior: Behavior normal.       Assessment & Plan     Viral bronchitis Rx tessalon for cough, can take up to BID. If no improvement with this can take robitussin. Advised she get sudafed from behind the counter the help her nasal congestion, as dayquil only helps some. Advised to only take for a few days, and that it may keep her awake, so to take in the morning. She can take advil for pain relief, tylenol -- she is cautious to use tylenol products due to history of mildly elevated liver enzymes. Ok short term. < 2,000 g tylenol daily.    Rx flonase, d/c afrin. Can use once or twice daily. Increase fluids, electrolytes.  Instructed that if she does not improve in the next 3-5 days, to proceed with the chest xray to r/o pneumo. If her fever returns, should proceed with chest xray and call office. Pt verbalized understanding.    Return if symptoms worsen or fail to improve.     I discussed the assessment and treatment plan with the patient. The patient was provided an opportunity to ask questions and all were answered. The patient agreed with the plan and demonstrated an understanding of the instructions.   The patient was advised to call back or seek an in-person evaluation if the symptoms worsen or if the condition fails to improve as anticipated.  I provided 15 minutes of non-face-to-face time during this encounter.  I, Mikey Kirschner, PA-C have reviewed all documentation for this visit. The documentation on  07/31/2021 for the exam, diagnosis, procedures, and orders are all accurate and complete.   Mikey Kirschner, PA-C [facility] [phone] (phone) [phone] (fax)  [place]

## 2021-07-31 ENCOUNTER — Encounter: Payer: Self-pay | Admitting: Physician Assistant

## 2021-07-31 ENCOUNTER — Other Ambulatory Visit: Payer: Self-pay

## 2021-07-31 ENCOUNTER — Telehealth (INDEPENDENT_AMBULATORY_CARE_PROVIDER_SITE_OTHER): Payer: Self-pay | Admitting: Physician Assistant

## 2021-07-31 VITALS — Ht 64.0 in | Wt 200.0 lb

## 2021-07-31 DIAGNOSIS — J208 Acute bronchitis due to other specified organisms: Secondary | ICD-10-CM

## 2021-07-31 DIAGNOSIS — R0981 Nasal congestion: Secondary | ICD-10-CM

## 2021-07-31 MED ORDER — BENZONATATE 100 MG PO CAPS
100.0000 mg | ORAL_CAPSULE | Freq: Two times a day (BID) | ORAL | 0 refills | Status: DC | PRN
Start: 2021-07-31 — End: 2021-09-04

## 2021-07-31 MED ORDER — FLUTICASONE PROPIONATE 50 MCG/ACT NA SUSP: 2.0000 | Freq: Every day | NASAL | 6 refills | Status: DC

## 2021-08-04 ENCOUNTER — Other Ambulatory Visit: Payer: Self-pay | Admitting: Physician Assistant

## 2021-08-04 ENCOUNTER — Ambulatory Visit: Payer: Self-pay | Admitting: *Deleted

## 2021-08-04 DIAGNOSIS — J189 Pneumonia, unspecified organism: Secondary | ICD-10-CM

## 2021-08-04 MED ORDER — ALBUTEROL SULFATE HFA 108 (90 BASE) MCG/ACT IN AERS: 2.0000 | INHALATION_SPRAY | Freq: Four times a day (QID) | RESPIRATORY_TRACT | 2 refills | Status: DC | PRN

## 2021-08-04 MED ORDER — LEVOFLOXACIN 750 MG PO TABS: 750.0000 mg | ORAL_TABLET | Freq: Every day | ORAL | 0 refills | Status: DC

## 2021-08-04 NOTE — Telephone Encounter (Signed)
C/o worsening symptoms of cough since last week . Completed VV 07/31/21 and told to call back if symptoms not resolving. C/o productive cough and pink , blood streaks in sputum. C/o shortness of breath sating on Saturday with exertion and chest pain when coughing. Chest pain comes and goes only with coughing. Denies fever, difficulty breathing. Patient requesting not to have chest x ray due to no insurance and unsure if she can make payment at this time. Instructed patient to go to ED due to new symptoms of chest pain and shortness of breath. Patient able to talk with out difficulty breathing of cough. Please advise . Care advise given. Patient verbalized understanding of care advise and to go to ED but would like to talk to PCP .

## 2021-08-04 NOTE — Telephone Encounter (Signed)
Reason for Disposition  Chest pain  (Exception: MILD central chest pain, present only when coughing)  Answer Assessment - Initial Assessment Questions 1. ONSET: "When did the cough begin?"      Last week and did VV on 07/31/21 2. SEVERITY: "How bad is the cough today?"      Shortness of breath and chest pain with coughing  3. SPUTUM: "Describe the color of your sputum" (none, dry cough; clear, white, yellow, green)     Clear to yellow  4. HEMOPTYSIS: "Are you coughing up any blood?" If so ask: "How much?" (flecks, streaks, tablespoons, etc.)     Pink blood streaks  5. DIFFICULTY BREATHING: "Are you having difficulty breathing?" If Yes, ask: "How bad is it?" (e.g., mild, moderate, severe)    - MILD: No SOB at rest, mild SOB with walking, speaks normally in sentences, can lie down, no retractions, pulse < 100.    - MODERATE: SOB at rest, SOB with minimal exertion and prefers to sit, cannot lie down flat, speaks in phrases, mild retractions, audible wheezing, pulse 100-120.    - SEVERE: Very SOB at rest, speaks in single words, struggling to breathe, sitting hunched forward, retractions, pulse > 120      Moderate  6. FEVER: "Do you have a fever?" If Yes, ask: "What is your temperature, how was it measured, and when did it start?"     No  7. CARDIAC HISTORY: "Do you have any history of heart disease?" (e.g., heart attack, congestive heart failure)      Na  8. LUNG HISTORY: "Do you have any history of lung disease?"  (e.g., pulmonary embolus, asthma, emphysema)     na 9. PE RISK FACTORS: "Do you have a history of blood clots?" (or: recent major surgery, recent prolonged travel, bedridden)     na 10. OTHER SYMPTOMS: "Do you have any other symptoms?" (e.g., runny nose, wheezing, chest pain)       Chest pain comes and goes with coughing  11. PREGNANCY: "Is there any chance you are pregnant?" "When was your last menstrual period?"       na 12. TRAVEL: "Have you traveled out of the country in the  last month?" (e.g., travel history, exposures)       na  Protocols used: Cough - Acute Productive-A-AH

## 2021-08-04 NOTE — Telephone Encounter (Signed)
I will send in antibiotics and an albuterol inhaler for her to use as needed for wheezing/sob but she does need a chest xray. Either she can have outpatient or go to the ED. ED will give her meds, fluids, and do a chest xray.

## 2021-08-04 NOTE — Progress Notes (Signed)
Pt's sxs worsening see triage note

## 2021-08-04 NOTE — Telephone Encounter (Signed)
Patient has been advised of the recommendations as instructed and agrees to get the x-ray today

## 2021-08-05 ENCOUNTER — Telehealth: Payer: Self-pay | Admitting: Physician Assistant

## 2021-08-05 ENCOUNTER — Encounter: Payer: Self-pay | Admitting: Physician Assistant

## 2021-08-05 NOTE — Telephone Encounter (Signed)
Called pt to see how she is doing, no cxr on record yet. No answer unable to leave message

## 2021-08-05 NOTE — Telephone Encounter (Signed)
Called patient. She answered the phone, but would not reply when prompted. Patient hung up without speaking.

## 2021-08-06 NOTE — Telephone Encounter (Signed)
Call attempted, NA.

## 2021-08-07 ENCOUNTER — Other Ambulatory Visit: Payer: Self-pay | Admitting: Physician Assistant

## 2021-08-07 DIAGNOSIS — J189 Pneumonia, unspecified organism: Secondary | ICD-10-CM

## 2021-08-07 NOTE — Telephone Encounter (Signed)
Please review.  KP

## 2021-08-07 NOTE — Telephone Encounter (Signed)
Pt reached out thru Mychart will respond to pt there.  KP

## 2021-08-08 ENCOUNTER — Telehealth: Payer: Self-pay | Admitting: *Deleted

## 2021-08-08 NOTE — Chronic Care Management (AMB) (Signed)
  Care Management   Outreach Note  08/08/2021 Name: Katrina Bryant MRN: [id number] DOB: Aug 05, 1996  Referred by: Virginia Crews, MD Reason for referral : Care Coordination (Outreach to schedule referral with Licensed Clinical SW)   An unsuccessful telephone outreach was attempted today. The patient was referred to the case management team for assistance with care management and care coordination.   Follow Up Plan:  A HIPAA compliant phone message was left for the patient providing contact information and requesting a return call.  If patient returns call to provider office, please advise to call Embedded Care Management Care Guide Dynasia Kercheval at [place], [place] Management  Direct Dial: [phone]

## 2021-08-12 NOTE — Chronic Care Management (AMB) (Signed)
°  Care Management   Outreach Note  08/12/2021 Name: Katrina Bryant MRN: [id number] DOB: November 04, 1995  Referred by: Virginia Crews, MD Reason for referral : Care Coordination (Outreach to schedule referral with Licensed Clinical SW)   A second unsuccessful telephone outreach was attempted today. The patient was referred to the case management team for assistance with care management and care coordination.   Follow Up Plan:  If patient returns call to provider office, please advise to call Embedded Care Management Care Guide Koua Deeg at [place], [place] Management  Direct Dial: [phone]

## 2021-08-14 NOTE — Chronic Care Management (AMB) (Signed)
°  Care Management   Outreach Note  08/14/2021 Name: Katrina Bryant MRN: [id number] DOB: [birth year]  Referred by: Virginia Crews, MD Reason for referral : Care Coordination (Outreach to schedule referral with Licensed Clinical SW)   Third unsuccessful telephone outreach was attempted today. The patient was referred to the case management team for assistance with care management and care coordination. The patient's primary care provider has been notified of our unsuccessful attempts to make or maintain contact with the patient. The care management team is pleased to engage with this patient at any time in the future should he/she be interested in assistance from the care management team.   Follow Up Plan:  We have been unable to make contact with the patient for follow up. The care management team is available to follow up with the patient after provider conversation with the patient regarding recommendation for care management engagement and subsequent re-referral to the care management team.   Julian Hy, [place] Management  Direct Dial: [phone]

## 2021-09-04 ENCOUNTER — Other Ambulatory Visit: Payer: Self-pay

## 2021-09-04 ENCOUNTER — Ambulatory Visit
Admission: EM | Admit: 2021-09-04 | Discharge: 2021-09-04 | Disposition: A | Discharge status: Home / Self Care | Payer: Self-pay | Attending: Internal Medicine | Admitting: Internal Medicine

## 2021-09-04 ENCOUNTER — Encounter: Payer: Self-pay | Admitting: *Deleted

## 2021-09-04 DIAGNOSIS — R109 Unspecified abdominal pain: Secondary | ICD-10-CM | POA: Insufficient documentation

## 2021-09-04 DIAGNOSIS — Z5321 Procedure and treatment not carried out due to patient leaving prior to being seen by health care provider: Secondary | ICD-10-CM | POA: Insufficient documentation

## 2021-09-04 DIAGNOSIS — R1110 Vomiting, unspecified: Secondary | ICD-10-CM | POA: Insufficient documentation

## 2021-09-04 DIAGNOSIS — J09X2 Influenza due to identified novel influenza A virus with other respiratory manifestations: Secondary | ICD-10-CM | POA: Insufficient documentation

## 2021-09-04 LAB — CBC
HCT: 40.9 % (ref 36.0–46.0)
Hemoglobin: 13.8 g/dL (ref 12.0–15.0)
MCH: 29.6 pg (ref 26.0–34.0)
MCHC: 33.7 g/dL (ref 30.0–36.0)
MCV: 87.8 fL (ref 80.0–100.0)
Platelets: 262 10*3/uL (ref 150–400)
RBC: 4.66 MIL/uL (ref 3.87–5.11)
RDW: 12.5 % (ref 11.5–15.5)
WBC: 9.7 10*3/uL (ref 4.0–10.5)
nRBC: 0 % (ref 0.0–0.2)

## 2021-09-04 LAB — RAPID INFLUENZA A&B ANTIGENS
Influenza A (ARMC): POSITIVE — AB
Influenza B (ARMC): NEGATIVE

## 2021-09-04 LAB — GROUP A STREP BY PCR: Group A Strep by PCR: NOT DETECTED

## 2021-09-04 MED ORDER — GUAIFENESIN-CODEINE 200-10 MG/5ML PO LIQD: ORAL | 0 refills | Status: DC

## 2021-09-04 MED ORDER — OSELTAMIVIR PHOSPHATE 75 MG PO CAPS: 75.0000 mg | ORAL_CAPSULE | Freq: Two times a day (BID) | ORAL | 0 refills | Status: DC

## 2021-09-04 MED ORDER — BENZONATATE 200 MG PO CAPS: 200.0000 mg | ORAL_CAPSULE | Freq: Three times a day (TID) | ORAL | 0 refills | Status: DC | PRN

## 2021-09-04 NOTE — ED Provider Notes (Signed)
[facility]    CSN: [id number] Arrival date & time: 09/04/21  1339      History   Chief Complaint Chief Complaint  Patient presents with   Sore Throat   Chills    HPI Katrina Bryant is a 26 y.o. female who presents with onset of cough, chills, sweating, HA and body aches x 2 days. Gets nausea when she tries to eat. Has not lost her appetite. States she was treated for pneumonia with antibiotics in November and recovered from that illness, then mid December had 2 days of URI but resolved. Has noticed her mouth salivating a lot.     Past Medical History:  Diagnosis Date   Allergy    Anxiety    Breast pain    Colitis    Depression    Diffuse abdominal pain 04/22/2021   Irregular menstrual cycle    Nausea and vomiting 04/22/2021   Pelvic pain     Patient Active Problem List   Diagnosis Date Noted   Irritable bowel syndrome with both constipation and diarrhea 04/22/2021   Elevated LFTs 05/24/2018    Past Surgical History:  Procedure Laterality Date   BREAST BIOPSY Right 03/08/2018   Affirm Bx-  X clip INTRADUCTAL PAPILLOMA.    COLONOSCOPY  03/02/2018    OB History     Gravida  0   Para  0   Term  0   Preterm  0   AB  0   Living  0      SAB  0   IAB  0   Ectopic  0   Multiple  0   Live Births  0        Obstetric Comments  Menstrual age: 71            Home Medications    Prior to Admission medications   Medication Sig Start Date End Date Taking? Authorizing Provider  benzonatate (TESSALON) 200 MG capsule Take 1 capsule (200 mg total) by mouth 3 (three) times daily as needed for cough. 09/04/21  Yes Rodriguez-Southworth, Sunday Spillers, PA-C  guaiFENesin-Codeine 200-10 MG/5ML LIQD 5 ml qhs prn night time cough 09/04/21  Yes Rodriguez-Southworth, Sunday Spillers, PA-C  oseltamivir (TAMIFLU) 75 MG capsule Take 1 capsule (75 mg total) by mouth every 12 (twelve) hours. 09/04/21  Yes Rodriguez-Southworth, Sunday Spillers, PA-C  albuterol (VENTOLIN HFA)  108 (90 Base) MCG/ACT inhaler Inhale 2 puffs into the lungs every 6 (six) hours as needed for wheezing or shortness of breath. 08/04/21   Mikey Kirschner, PA-C  fluticasone (FLONASE) 50 MCG/ACT nasal spray Place 2 sprays into both nostrils daily. 07/31/21   Mikey Kirschner, PA-C  levofloxacin (LEVAQUIN) 750 MG tablet Take 1 tablet (750 mg total) by mouth daily. 08/04/21   Mikey Kirschner, PA-C    Family History Family History  Problem Relation Age of Onset   Diabetes Father    Hyperlipidemia Father    Hypertension Father    Hypertension Mother    Healthy Sister    Healthy Brother    Breast cancer Neg Hx    Ovarian cancer Neg Hx    Colon cancer Neg Hx    Cervical cancer Neg Hx     Social History Social History   Tobacco Use   Smoking status: Never   Smokeless tobacco: Never  Vaping Use   Vaping Use: Some days  Substance Use Topics   Alcohol use: Never   Drug use: Never     Allergies   Amoxicillin,  Bactrim [sulfamethoxazole-trimethoprim], Biaxin [clarithromycin], Cephalosporins, Clindamycin/lincomycin, and Penicillins   Review of Systems Review of Systems  Constitutional:  Positive for chills, diaphoresis and fatigue. Negative for activity change, appetite change and fever.  HENT:  Positive for ear pain and postnasal drip. Negative for congestion, ear discharge, sore throat and trouble swallowing.   Eyes:  Negative for discharge.  Respiratory:  Positive for cough. Negative for shortness of breath and wheezing.   Musculoskeletal:  Positive for myalgias.  Neurological:  Positive for headaches.    Physical Exam Triage Vital Signs ED Triage Vitals  Enc Vitals Group     BP 09/04/21 1449 (!) 150/101     Pulse Rate 09/04/21 1449 (S) (!) 113     Resp 09/04/21 1449 16     Temp 09/04/21 1449 98.6 F (37 C)     Temp Source 09/04/21 1449 Oral     SpO2 09/04/21 1449 98 %     Weight --      Height --      Head Circumference --      Peak Flow --      Pain Score 09/04/21  1446 7     Pain Loc --      Pain Edu? --      Excl. in [place]? --    No data found.  Updated Vital Signs BP (!) 150/101 (BP Location: Left Arm)    Pulse (S) (!) 113    Temp 98.6 F (37 C) (Oral)    Resp 16    LMP 08/25/2021    SpO2 98%   Visual Acuity Right Eye Distance:   Left Eye Distance:   Bilateral Distance:    Right Eye Near:   Left Eye Near:    Bilateral Near:     Physical Exam Physical Exam Vitals signs and nursing note reviewed.  Constitutional:      General: She is not in acute distress.    Appearance: Normal appearance. She is  ill-appearing, but not toxic-appearing but her skin is clammy HENT:     Head: Normocephalic.     Right Ear: Tympanic membrane, ear canal and external ear normal.     Left Ear: Tympanic membrane, ear canal and external ear normal.     Nose: Nose normal.     Mouth/Throat: clear    Mouth: Mucous membranes are moist.  Eyes:     General: No scleral icterus.       Right eye: No discharge.        Left eye: No discharge.     Conjunctiva/sclera: Conjunctivae normal.  Neck:     Musculoskeletal: Neck supple. No neck rigidity.  Cardiovascular:     Rate and Rhythm: Normal rate and regular rhythm.     Heart sounds: No murmur.  Pulmonary: Has intermittent coughing spells    Effort: Pulmonary effort is normal.     Breath sounds: Normal breath sounds.  Abdominal:     General: Bowel sounds are normal. There is no distension.     Palpations: Abdomen is soft. There is no mass.     Tenderness: There is no abdominal tenderness. There is no guarding or rebound.     Hernia: No hernia is present.  Musculoskeletal: Normal range of motion.  Lymphadenopathy:     Cervical: No cervical adenopathy.  Skin:    General: Skin is warm and dry.     Coloration: Skin is not jaundiced.     Findings: No rash.  Neurological:  Mental Status: She is alert and oriented to person, place, and time.     Gait: Gait normal.  Psychiatric:        Mood and Affect: Mood  normal.        Behavior: Behavior normal.        Thought Content: Thought content normal.        Judgment: Judgment normal.    UC Treatments / Results  Labs (all labs ordered are listed, but only abnormal results are displayed) Labs Reviewed  RAPID INFLUENZA A&B ANTIGENS - Abnormal; Notable for the following components:      Result Value   Influenza A ([facility]) POSITIVE (*)    All other components within normal limits  GROUP A STREP BY PCR  Strep PCR Flu B is negative  EKG   Radiology No results found.  Procedures Procedures (including critical care time)  Medications Ordered in UC Medications - No data to display  Initial Impression / Assessment and Plan / UC Course  I have reviewed the triage vital signs and the nursing notes. Pertinent labs  results that were available during my care of the patient were reviewed by me and considered in my medical decision making (see chart for details). Has influenza A Placed on Tamiflu and Tessalon as noted.     Final Clinical Impressions(s) / UC Diagnoses   Final diagnoses:  Influenza due to identified novel influenza A virus with other respiratory manifestations     Discharge Instructions      Your strep test is negative. You have Flu A( seasonal flu)      ED Prescriptions     Medication Sig Dispense Auth. Provider   oseltamivir (TAMIFLU) 75 MG capsule Take 1 capsule (75 mg total) by mouth every 12 (twelve) hours. 10 capsule Rodriguez-Southworth, Sunday Spillers, PA-C   benzonatate (TESSALON) 200 MG capsule Take 1 capsule (200 mg total) by mouth 3 (three) times daily as needed for cough. 30 capsule Rodriguez-Southworth, Sunday Spillers, PA-C   guaiFENesin-Codeine 200-10 MG/5ML LIQD 5 ml qhs prn night time cough 120 mL Rodriguez-Southworth, Sunday Spillers, PA-C      PDMP not reviewed this encounter.   Shelby Mattocks, PA-C 09/04/21 1540

## 2021-09-04 NOTE — ED Triage Notes (Signed)
Pt dx with flu at urgent care today.  Pt has abd pain with vomiting.  No diarrhea.  Pt alert speech clear.

## 2021-09-04 NOTE — Discharge Instructions (Addendum)
Your strep test is negative. You have Flu A( seasonal flu)

## 2021-09-04 NOTE — ED Triage Notes (Signed)
Patient presents to Urgent Care with complaints of sore throat, body aches, and chills x 2 days ago. Treating symptoms with mucinex.   Denies fever.

## 2021-09-04 NOTE — ED Triage Notes (Addendum)
Pt states she is concerned she may have pneumonia. Has developed upper back pain and chest discomfort. Pt states she was sick back in November and due to lack of insurance unable to get a chest x-ray then. She was treated with antibiotics improving her symptoms.   Denies SOB.

## 2021-09-05 ENCOUNTER — Emergency Department
Admission: EM | Admit: 2021-09-05 | Discharge: 2021-09-05 | Disposition: A | Discharge status: Home / Self Care | Payer: Self-pay | Attending: Emergency Medicine | Admitting: Emergency Medicine

## 2021-09-05 LAB — COMPREHENSIVE METABOLIC PANEL
ALT: 57 U/L — ABNORMAL HIGH (ref 0–44)
AST: 47 U/L — ABNORMAL HIGH (ref 15–41)
Albumin: 4.7 g/dL (ref 3.5–5.0)
Alkaline Phosphatase: 65 U/L (ref 38–126)
Anion gap: 8 (ref 5–15)
BUN: 14 mg/dL (ref 6–20)
CO2: 20 mmol/L — ABNORMAL LOW (ref 22–32)
Calcium: 9.1 mg/dL (ref 8.9–10.3)
Chloride: 109 mmol/L (ref 98–111)
Creatinine, Ser: 0.58 mg/dL (ref 0.44–1.00)
GFR, Estimated: >60 mL/min (ref >60)
Glucose, Bld: 118 mg/dL — ABNORMAL HIGH (ref 70–99)
Potassium: 3.2 mmol/L — ABNORMAL LOW (ref 3.5–5.1)
Sodium: 137 mmol/L (ref 135–145)
Total Bilirubin: 1.6 mg/dL — ABNORMAL HIGH (ref 0.3–1.2)
Total Protein: 8.7 g/dL — ABNORMAL HIGH (ref 6.5–8.1)

## 2021-09-05 LAB — LIPASE, BLOOD: Lipase: 29 U/L (ref 11–51)

## 2021-09-08 ENCOUNTER — Encounter: Payer: Self-pay | Admitting: Family Medicine

## 2022-01-27 ENCOUNTER — Ambulatory Visit: Payer: Self-pay | Admitting: *Deleted

## 2022-01-27 NOTE — Telephone Encounter (Signed)
  Chief Complaint: abdominal pain, vomiting Symptoms: left upper abdominal pain , under ribs to back . Comes and goes  vomiting yellow emesis sour taste, black stool noted a couple of weeks ago . Bloating gas and burping. C/o feeling hungry makes symptoms worse. C/o cough, brown phlegm, stuffy nose  Frequency: started Thursday 01/22/22 Pertinent Negatives: Patient denies chest pain difficulty breathing no fever Disposition: '[]'$ ED /'[]'$ Urgent Care (no appt availability in office) / '[x]'$ Appointment(In office/virtual)/ '[]'$  [facility]/ '[]'$ Home Care/ '[]'$ Refused Recommended Disposition /'[]'$ [facility]/ '[]'$  Follow-up with PCP Additional Notes:  Appt scheduled for tomorrow    Reason for Disposition  [1] MODERATE pain (e.g., interferes with normal activities) AND [2] pain comes and goes (cramps) AND [3] present > 24 hours  (Exception: pain with Vomiting or Diarrhea - see that Guideline)  Answer Assessment - Initial Assessment Questions 1. LOCATION: "Where does it hurt?"      Left upper abdominal pain under ribs  2. RADIATION: "Does the pain shoot anywhere else?" (e.g., chest, back)     Back  3. ONSET: "When did the pain begin?" (e.g., minutes, hours or days ago)      Has been ongoing for months but worsening since 01/22/22 4. SUDDEN: "Gradual or sudden onset?"     na 5. PATTERN "Does the pain come and go, or is it constant?"    - If constant: "Is it getting better, staying the same, or worsening?"      (Note: Constant means the pain never goes away completely; most serious pain is constant and it progresses)     - If intermittent: "How long does it last?" "Do you have pain now?"     (Note: Intermittent means the pain goes away completely between bouts)     Comes and goes  6. SEVERITY: "How bad is the pain?"  (e.g., Scale 1-10; mild, moderate, or severe)   - MILD (1-3): doesn't interfere with normal activities, abdomen soft and not tender to touch    - MODERATE (4-7): interferes with  normal activities or awakens from sleep, abdomen tender to touch    - SEVERE (8-10): excruciating pain, doubled over, unable to do any normal activities      Moderate  has had abdominal tenderness  7. RECURRENT SYMPTOM: "Have you ever had this type of stomach pain before?" If Yes, ask: "When was the last time?" and "What happened that time?"      Yes  8. CAUSE: "What do you think is causing the stomach pain?"     Not sure  9. RELIEVING/AGGRAVATING FACTORS: "What makes it better or worse?" (e.g., movement, antacids, bowel movement)     Caffeine worse  10. OTHER SYMPTOMS: "Do you have any other symptoms?" (e.g., back pain, diarrhea, fever, urination pain, vomiting)       Black stool,vomiting yellow sour taste this am, black stool, abdominal pain gas worse and constantly burping. C/o cough  brown phlegm , stuffy nose  11. PREGNANCY: "Is there any chance you are pregnant?" "When was your last menstrual period?"       na  Protocols used: Abdominal Pain - [facility]

## 2022-01-28 ENCOUNTER — Encounter: Payer: Self-pay | Admitting: Family Medicine

## 2022-01-28 ENCOUNTER — Ambulatory Visit: Payer: Self-pay | Admitting: Family Medicine

## 2022-01-28 VITALS — BP 132/91 | HR 68 | Temp 98.6°F | Resp 16 | Ht 64.0 in | Wt 191.1 lb

## 2022-01-28 DIAGNOSIS — K2100 Gastro-esophageal reflux disease with esophagitis, without bleeding: Secondary | ICD-10-CM | POA: Insufficient documentation

## 2022-01-28 DIAGNOSIS — R030 Elevated blood-pressure reading, without diagnosis of hypertension: Secondary | ICD-10-CM | POA: Insufficient documentation

## 2022-01-28 DIAGNOSIS — R1012 Left upper quadrant pain: Secondary | ICD-10-CM | POA: Insufficient documentation

## 2022-01-28 DIAGNOSIS — E669 Obesity, unspecified: Secondary | ICD-10-CM

## 2022-01-28 DIAGNOSIS — J301 Allergic rhinitis due to pollen: Secondary | ICD-10-CM

## 2022-01-28 MED ORDER — OMEPRAZOLE 20 MG PO CPDR: 20.0000 mg | DELAYED_RELEASE_CAPSULE | Freq: Every day | ORAL | 3 refills | Status: DC

## 2022-01-28 MED ORDER — CETIRIZINE HCL 10 MG PO TABS: 10.0000 mg | ORAL_TABLET | Freq: Every day | ORAL | 11 refills | Status: DC

## 2022-01-28 MED ORDER — FLUTICASONE PROPIONATE 50 MCG/ACT NA SUSP: 2.0000 | Freq: Every day | NASAL | 6 refills | Status: DC

## 2022-01-28 NOTE — Assessment & Plan Note (Signed)
Presumed, recommend smaller, more frequent meals Recommend use of PPI trial as acid reducer  Plan for imaging if symptoms do not improve Will test for H Pylori- concern for exposure from fiance

## 2022-01-28 NOTE — Assessment & Plan Note (Addendum)
Acute, stable Denies travel Denies sick contacts Wishes to test for H Pylori Plan to do imaging if negative Denies pregnancy concerns, finished menses earlier this week

## 2022-01-28 NOTE — Assessment & Plan Note (Addendum)
BP elevated; however, pt has complaints of abdominal pain  If BP remains elevated, recommend starting HCTZ or ACEi/ARB

## 2022-01-28 NOTE — Assessment & Plan Note (Signed)
Chronic, recurrent Has nasal congestion and spits up phlegm in the morning Denies sick contacts Denies fevering Recommend flonase and zyrtec daily Once insured, recommend allergy testing

## 2022-01-28 NOTE — Progress Notes (Addendum)
Established patient visit   Patient: Katrina Bryant   DOB: 06/13/1996   26 y.o. Female  MRN: [id number] Visit Date: 01/28/2022  Today's healthcare provider: Gwyneth Sprout, FNP  Re Introduced to nurse practitioner role and practice setting.  All questions answered.  Discussed provider/patient relationship and expectations.   I,Tiffany J Bragg,acting as a scribe for Gwyneth Sprout, FNP.,have documented all relevant documentation on the behalf of Gwyneth Sprout, FNP,as directed by  Gwyneth Sprout, FNP while in the presence of Gwyneth Sprout, FNP.   Chief Complaint  Patient presents with   URI    Patient complains of congestion and productive cough for 5 days.    Abdominal Pain    Patient complains of LUQ pain that sometimes radiates to the back and buttocks, with nausea and vomitting and dark stools for 6 days.    Subjective    HPI HPI     URI    Additional comments: Patient complains of congestion and productive cough for 5 days.         Abdominal Pain    Additional comments: Patient complains of LUQ pain that sometimes radiates to the back and buttocks, with nausea and vomitting and dark stools for 6 days.       Last edited by Smitty Knudsen, CMA on 01/28/2022  2:02 PM.        Medications: Outpatient Medications Prior to Visit  Medication Sig   albuterol (VENTOLIN HFA) 108 (90 Base) MCG/ACT inhaler Inhale 2 puffs into the lungs every 6 (six) hours as needed for wheezing or shortness of breath. (Patient not taking: Reported on 01/28/2022)   [DISCONTINUED] benzonatate (TESSALON) 200 MG capsule Take 1 capsule (200 mg total) by mouth 3 (three) times daily as needed for cough. (Patient not taking: Reported on 01/28/2022)   [DISCONTINUED] fluticasone (FLONASE) 50 MCG/ACT nasal spray Place 2 sprays into both nostrils daily.   [DISCONTINUED] guaiFENesin-Codeine 200-10 MG/5ML LIQD 5 ml qhs prn night time cough   [DISCONTINUED] levofloxacin (LEVAQUIN) 750 MG tablet Take 1  tablet (750 mg total) by mouth daily.   [DISCONTINUED] oseltamivir (TAMIFLU) 75 MG capsule Take 1 capsule (75 mg total) by mouth every 12 (twelve) hours.   No facility-administered medications prior to visit.    Review of Systems     Objective    BP (!) 132/91 (BP Location: Right Arm, Patient Position: Sitting, Cuff Size: Normal)   Pulse 68   Temp 98.6 F (37 C) (Oral)   Resp 16   Ht '5\' 4"'$  (1.626 m)   Wt 191 lb 1.6 oz (86.7 kg)   SpO2 98%   BMI 32.80 kg/m    Physical Exam Vitals and nursing note reviewed.  Constitutional:      General: She is not in acute distress.    Appearance: Normal appearance. She is obese. She is not ill-appearing, toxic-appearing or diaphoretic.  HENT:     Head: Normocephalic and atraumatic.  Cardiovascular:     Rate and Rhythm: Normal rate and regular rhythm.     Pulses: Normal pulses.     Heart sounds: Normal heart sounds. No murmur heard.    No friction rub. No gallop.  Pulmonary:     Effort: Pulmonary effort is normal. No respiratory distress.     Breath sounds: Normal breath sounds. No stridor. No wheezing, rhonchi or rales.  Chest:     Chest wall: No tenderness.  Abdominal:     General: Abdomen  is flat. Bowel sounds are normal. There is no distension or abdominal bruit. There are no signs of injury.     Palpations: Abdomen is soft.     Tenderness: There is abdominal tenderness in the left upper quadrant. There is no right CVA tenderness, left CVA tenderness or guarding. Negative signs include Murphy's sign and Rovsing's sign.     Hernia: No hernia is present.  Musculoskeletal:        General: No swelling, tenderness, deformity or signs of injury. Normal range of motion.     Right lower leg: No edema.     Left lower leg: No edema.  Skin:    General: Skin is warm and dry.     Capillary Refill: Capillary refill takes less than 2 seconds.     Coloration: Skin is not jaundiced or pale.     Findings: No bruising, erythema, lesion or rash.   Neurological:     General: No focal deficit present.     Mental Status: She is alert and oriented to person, place, and time. Mental status is at baseline.     Cranial Nerves: No cranial nerve deficit.     Sensory: No sensory deficit.     Motor: No weakness.     Coordination: Coordination normal.  Psychiatric:        Mood and Affect: Mood normal.        Behavior: Behavior normal.        Thought Content: Thought content normal.        Judgment: Judgment normal.       No results found for any visits on 01/28/22.  Assessment & Plan     Problem List Items Addressed This Visit       Respiratory   Non-seasonal allergic rhinitis due to pollen    Chronic, recurrent Has nasal congestion and spits up phlegm in the morning Denies sick contacts Denies fevering Recommend flonase and zyrtec daily Once insured, recommend allergy testing        Relevant Medications   fluticasone (FLONASE) 50 MCG/ACT nasal spray   cetirizine (ZYRTEC) 10 MG tablet     Digestive   Gastroesophageal reflux disease with esophagitis without hemorrhage    Presumed, recommend smaller, more frequent meals Recommend use of PPI trial as acid reducer  Plan for imaging if symptoms do not improve Will test for H Pylori- concern for exposure from fiance        Relevant Medications   omeprazole (PRILOSEC) 20 MG capsule     Other   Elevated blood pressure reading without diagnosis of hypertension    BP elevated; however, pt has complaints of abdominal pain  If BP remains elevated, recommend starting HCTZ or ACEi/ARB       Left upper quadrant abdominal pain - Primary    Acute, stable Denies travel Denies sick contacts Wishes to test for H Pylori Plan to do imaging if negative Denies pregnancy concerns, finished menses earlier this week       Relevant Orders   H. pylori breath test   Obesity (BMI 30.0-34.9)    Body mass index is 32.8 kg/m. Continue to recommend balanced, lower carb meals.  Smaller meal size, adding snacks. Choosing water as drink of choice and increasing purposeful exercise.          Return in about 1 month (around 02/27/2022) for chonic disease management.      Vonna Kotyk, FNP, have reviewed all documentation for this visit. The documentation on  01/28/22 for the exam, diagnosis, procedures, and orders are all accurate and complete.    Gwyneth Sprout, [place] [phone] (phone) [phone] (fax)  [place]

## 2022-01-28 NOTE — Assessment & Plan Note (Signed)
Body mass index is 32.8 kg/m. Continue to recommend balanced, lower carb meals. Smaller meal size, adding snacks. Choosing water as drink of choice and increasing purposeful exercise.

## 2022-01-29 LAB — H. PYLORI BREATH TEST: H pylori Breath Test: NEGATIVE

## 2022-01-30 ENCOUNTER — Encounter: Payer: Self-pay | Admitting: Family Medicine

## 2022-02-02 NOTE — Telephone Encounter (Signed)
Forwarding to provider that saw patient for these concerns.

## 2022-02-03 ENCOUNTER — Ambulatory Visit: Payer: Self-pay

## 2022-02-03 NOTE — Telephone Encounter (Signed)
Attempted call, NA.

## 2022-02-03 NOTE — Telephone Encounter (Signed)
We should see if anyone else has openings today and not wait for my next available.

## 2022-02-03 NOTE — Progress Notes (Signed)
I,Sulibeya S Dimas,acting as a Education administrator for Lavon Paganini, MD.,have documented all relevant documentation on the behalf of Lavon Paganini, MD,as directed by  Lavon Paganini, MD while in the presence of Lavon Paganini, MD.   Established patient visit   Patient: Katrina Bryant   DOB: July 23, 1996   25 y.o. Female  MRN: [id number] Visit Date: 02/05/2022  Today's healthcare provider: Lavon Paganini, MD   Chief Complaint  Patient presents with   Abdominal Pain   Subjective    Abdominal Pain This is a recurrent problem. Episode onset: 2 weeks ago. The problem has been gradually worsening. The abdominal pain radiates to the LUQ. Associated symptoms include constipation, diarrhea, nausea and vomiting. Pertinent negatives include no dysuria, fever, frequency or hematuria.   Patient was seen for this problem on 01/28/2022 by Tally Joe, FNP.  From that visit, patient was advised to eat smaller, more frequent meals. Recommend use of PPI trial as acid reducer. H. Pylori test was ordered and the result was negative. recommend a trial of fiber, building up to 30 grams per day with use of fruits/vegetables, whole grains and metamucil if needed in addition to the use of prilosec to assist with presumed GERD. Imaging test was also recommended.  Patient reports taking Prilosec daily, reports no changes in symptom.   Sometimes it is radiating to back and RUQ.  +N/V. NBNB emesis.  Comes in waves - can be severe and then go back away. No exacerbating or alleviating factors. Has tried to cut back on fast food and that hasn't helped.  Omeprazole without relief.  Having BMs most days, but occasionally skips a day. Can be loose on some days or formed on other days.  Saw podiatry last year for b/l toenail fungus and was not a good candidate for oral meds due to LFT elevation.  Now havign pain I nR big toenail intermittently.  She is planning to get laser treatment but needs to settle into new  job first.  [place] if her abd pain is related to depression as it can come on at times in times of stress. Lost her job twice in the last 2 years. Used to take zoloft - cannot remember if it was helpful. Stopped due to losing insurance.     02/05/2022    8:07 AM 01/28/2022    2:05 PM 04/22/2021    9:20 AM 05/21/2020    4:47 PM 07/21/2019    3:34 PM  Depression screen PHQ 2/9  Decreased Interest '2 2 2 1 1  '$ Down, Depressed, Hopeless '3 3 2 2 3  '$ PHQ - 2 Score '5 5 4 3 4  '$ Altered sleeping '3 2 2 2 1  '$ Tired, decreased energy '2 2 3 2 2  '$ Change in appetite '3 3 1 2 1  '$ Feeling bad or failure about yourself  '3 2 2 2 3  '$ Trouble concentrating '2 2 1 1 3  '$ Moving slowly or fidgety/restless 1 1 0 0 1  Suicidal thoughts '2 1 1 1 2  '$ PHQ-9 Score '21 18 14 13 17  '$ Difficult doing work/chores Extremely dIfficult Somewhat difficult Somewhat difficult Somewhat difficult Very difficult     Medications: Outpatient Medications Prior to Visit  Medication Sig   cetirizine (ZYRTEC) 10 MG tablet Take 1 tablet (10 mg total) by mouth daily.   fluticasone (FLONASE) 50 MCG/ACT nasal spray Place 2 sprays into both nostrils daily.   omeprazole (PRILOSEC) 20 MG capsule Take 1 capsule (20 mg total) by  mouth daily.   [DISCONTINUED] albuterol (VENTOLIN HFA) 108 (90 Base) MCG/ACT inhaler Inhale 2 puffs into the lungs every 6 (six) hours as needed for wheezing or shortness of breath. (Patient not taking: Reported on 02/05/2022)   No facility-administered medications prior to visit.    Review of Systems  Constitutional:  Positive for activity change, appetite change and fatigue. Negative for chills and fever.  Respiratory:  Positive for chest tightness.   Gastrointestinal:  Positive for abdominal distention, abdominal pain, constipation, diarrhea, nausea and vomiting. Negative for blood in stool.  Genitourinary:  Positive for flank pain. Negative for dysuria, frequency, hematuria and vaginal discharge.  Psychiatric/Behavioral:   Positive for confusion, decreased concentration, dysphoric mood, sleep disturbance and suicidal ideas. The patient is nervous/anxious.     Last CBC Lab Results  Component Value Date   WBC 9.7 09/04/2021   HGB 13.8 09/04/2021   HCT 40.9 09/04/2021   MCV 87.8 09/04/2021   MCH 29.6 09/04/2021   RDW 12.5 09/04/2021   PLT 262 63/78/5885   Last metabolic panel Lab Results  Component Value Date   GLUCOSE 118 (H) 09/04/2021   NA 137 09/04/2021   K 3.2 (L) 09/04/2021   CL 109 09/04/2021   CO2 20 (L) 09/04/2021   BUN 14 09/04/2021   CREATININE 0.58 09/04/2021   GFRNONAA >60 09/04/2021   CALCIUM 9.1 09/04/2021   PHOS 3.6 04/22/2021   PROT 8.7 (H) 09/04/2021   ALBUMIN 4.7 09/04/2021   LABGLOB 3.0 05/21/2020   AGRATIO 1.6 05/21/2020   BILITOT 1.6 (H) 09/04/2021   ALKPHOS 65 09/04/2021   AST 47 (H) 09/04/2021   ALT 57 (H) 09/04/2021   ANIONGAP 8 09/04/2021       Objective    BP 130/85 (BP Location: Left Arm, Patient Position: Sitting, Cuff Size: Large)   Pulse 73   Temp 98.5 F (36.9 C) (Oral)   Resp 16   Wt 184 lb 8 oz (83.7 kg)   LMP 01/26/2022 (Approximate)   BMI 31.67 kg/m  BP Readings from Last 3 Encounters:  02/05/22 130/85  01/28/22 (!) 132/91  09/04/21 136/87   Wt Readings from Last 3 Encounters:  02/05/22 184 lb 8 oz (83.7 kg)  01/28/22 191 lb 1.6 oz (86.7 kg)  09/04/21 194 lb (88 kg)      Physical Exam Vitals reviewed.  Constitutional:      General: She is not in acute distress.    Appearance: Normal appearance. She is well-developed. She is not diaphoretic.  HENT:     Head: Normocephalic and atraumatic.  Eyes:     General: No scleral icterus.    Conjunctiva/sclera: Conjunctivae normal.  Neck:     Thyroid: No thyromegaly.  Cardiovascular:     Rate and Rhythm: Normal rate and regular rhythm.     Pulses: Normal pulses.     Heart sounds: Normal heart sounds. No murmur heard. Pulmonary:     Effort: Pulmonary effort is normal. No respiratory  distress.     Breath sounds: Normal breath sounds. No wheezing, rhonchi or rales.  Abdominal:     General: Abdomen is flat. Bowel sounds are normal. There is no distension.     Palpations: Abdomen is soft. There is no shifting dullness, hepatomegaly, splenomegaly or mass.     Tenderness: There is abdominal tenderness in the epigastric area and left upper quadrant. There is no right CVA tenderness, left CVA tenderness, guarding or rebound. Negative signs include Murphy's sign and McBurney's sign.  Musculoskeletal:  Cervical back: Neck supple.     Right lower leg: No edema.     Left lower leg: No edema.  Lymphadenopathy:     Cervical: No cervical adenopathy.  Skin:    General: Skin is warm and dry.     Findings: No rash.     Comments: Onychomycosis of toenails of both feet  Neurological:     Mental Status: She is alert and oriented to person, place, and time. Mental status is at baseline.  Psychiatric:        Mood and Affect: Mood normal.        Behavior: Behavior normal.       No results found for any visits on 02/05/22.  Assessment & Plan     Problem List Items Addressed This Visit       Musculoskeletal and Integument   Onychomycosis    Has already been evaluated by podiatry and laser treatment was recommended Patient is waiting for her new insurance to kick in with her new job to get this It is becoming more painful, so we discussed that she may need to see podiatry sooner if it worsens        Other   Elevated LFTs    Previously mildly elevated and stable She has had CT scan in 2021 that showed fatty liver changes, which is likely the culprit We will recheck today      Relevant Orders   Comprehensive metabolic panel   Left upper quadrant abdominal pain - Primary    Acute problem that is reevaluated from 2 weeks ago She has been on PPI and had negative H. pylori test without improvement She has not had any imaging Due to lack of insurance, it would be difficult  for her to afford a CT scan at this time I am suspicious for possible pancreatitis given location of pain epigastrically/left upper quadrant that radiates to the back Her abdominal exam is fairly benign and her vital signs are stable We will check CMP, CBC, lipase today Discussed bland diet, GI rest, tramadol sparingly for pain Encouraged her to hydrate well Do not suspect she needs stool studies at this time as she is having some normal stools as well and her complaints are mostly upper GI rather than lower      Relevant Orders   Comprehensive metabolic panel   Lipase   CBC   Moderate episode of recurrent major depressive disorder ([place])    Recurrent issue, uncontrolled She was previously on Zoloft and tolerated it fairly well No SI/HI Will Start Zoloft 50 mg daily Discussed potential side effects Discussed that it can take 6-8 weeks to reach full efficacy Contracted for safety - no SI/HI Discussed synergistic effects of medications and therapy  At follow-up in 2 months, repeat PHQ-9 and consider dose titration      Relevant Medications   sertraline (ZOLOFT) 50 MG tablet     Return in about 2 months (around 04/07/2022) for MDD/GAD f/u, virtual ok.      I, Lavon Paganini, MD, have reviewed all documentation for this visit. The documentation on 02/05/22 for the exam, diagnosis, procedures, and orders are all accurate and complete.   Avalee Castrellon, Dionne Bucy, MD, MPH [facility]

## 2022-02-03 NOTE — Telephone Encounter (Signed)
   Chief Complaint: Abdominal pain and symptoms getting worse. Pain 7/10 today. Asking to be seen sooner than 02/05/22 appointment. Wants to see Dr. Brita Romp only. Symptoms: Nausea and vomited x 2. Frequency: 2 weeks ago Pertinent Negatives: Patient denies fever Disposition: '[]'$ ED /'[]'$ Urgent Care (no appt availability in office) / '[]'$ Appointment(In office/virtual)/ '[]'$  [facility] [facility]/ '[]'$ Home Care/ '[]'$ Refused Recommended Disposition /'[]'$ [place] [facility]/ '[x]'$  Follow-up with PCP Additional Notes: Please advise pt.  Instructed to go to ED for worsening of symptoms. Answer Assessment - Initial Assessment Questions 1. LOCATION: "Where does it hurt?"      Left side at ribs  2. RADIATION: "Does the pain shoot anywhere else?" (e.g., chest, back)     Back and across to right 3. ONSET: "When did the pain begin?" (e.g., minutes, hours or days ago)      2 weeks ago 4. SUDDEN: "Gradual or sudden onset?"     Gradual 5. PATTERN "Does the pain come and go, or is it constant?"    - If constant: "Is it getting better, staying the same, or worsening?"      (Note: Constant means the pain never goes away completely; most serious pain is constant and it progresses)     - If intermittent: "How long does it last?" "Do you have pain now?"     (Note: Intermittent means the pain goes away completely between bouts)     Comes and goes 6. SEVERITY: "How bad is the pain?"  (e.g., Scale 1-10; mild, moderate, or severe)   - MILD (1-3): doesn't interfere with normal activities, abdomen soft and not tender to touch    - MODERATE (4-7): interferes with normal activities or awakens from sleep, abdomen tender to touch    - SEVERE (8-10): excruciating pain, doubled over, unable to do any normal activities      Now - 7 7. RECURRENT SYMPTOM: "Have you ever had this type of stomach pain before?" If Yes, ask: "When was the last time?" and "What happened that time?"      No 8. CAUSE: "What do you think is causing the  stomach pain?"     Unsure 9. RELIEVING/AGGRAVATING FACTORS: "What makes it better or worse?" (e.g., movement, antacids, bowel movement)     Prilosec 10. OTHER SYMPTOMS: "Do you have any other symptoms?" (e.g., back pain, diarrhea, fever, urination pain, vomiting)       Nausea,vomiting, loose stool 11. PREGNANCY: "Is there any chance you are pregnant?" "When was your last menstrual period?"       No  Protocols used: Abdominal Pain - [facility]

## 2022-02-03 NOTE — Telephone Encounter (Signed)
Patient returned call- she declines another provider- she wants to see her PCP. Patient advised UC/ED if she should get worse before appointment- she states she understands.

## 2022-02-05 ENCOUNTER — Encounter: Payer: Self-pay | Admitting: Family Medicine

## 2022-02-05 ENCOUNTER — Ambulatory Visit: Payer: Self-pay | Admitting: Family Medicine

## 2022-02-05 VITALS — BP 130/85 | HR 73 | Temp 98.5°F | Resp 16 | Wt 184.5 lb

## 2022-02-05 DIAGNOSIS — B351 Tinea unguium: Secondary | ICD-10-CM | POA: Insufficient documentation

## 2022-02-05 DIAGNOSIS — R1012 Left upper quadrant pain: Secondary | ICD-10-CM

## 2022-02-05 DIAGNOSIS — R7989 Other specified abnormal findings of blood chemistry: Secondary | ICD-10-CM

## 2022-02-05 DIAGNOSIS — F331 Major depressive disorder, recurrent, moderate: Secondary | ICD-10-CM | POA: Insufficient documentation

## 2022-02-05 MED ORDER — TRAMADOL HCL 50 MG PO TABS: 50.0000 mg | ORAL_TABLET | Freq: Three times a day (TID) | ORAL | 0 refills | Status: AC | PRN

## 2022-02-05 MED ORDER — SERTRALINE HCL 50 MG PO TABS: 50.0000 mg | ORAL_TABLET | Freq: Every day | ORAL | 3 refills | Status: DC

## 2022-02-05 MED ORDER — ONDANSETRON HCL 4 MG PO TABS: 4.0000 mg | ORAL_TABLET | Freq: Three times a day (TID) | ORAL | 0 refills | Status: DC | PRN

## 2022-02-05 NOTE — Assessment & Plan Note (Signed)
Has already been evaluated by podiatry and laser treatment was recommended Patient is waiting for her new insurance to kick in with her new job to get this It is becoming more painful, so we discussed that she may need to see podiatry sooner if it worsens

## 2022-02-05 NOTE — Assessment & Plan Note (Signed)
Acute problem that is reevaluated from 2 weeks ago She has been on PPI and had negative H. pylori test without improvement She has not had any imaging Due to lack of insurance, it would be difficult for her to afford a CT scan at this time I am suspicious for possible pancreatitis given location of pain epigastrically/left upper quadrant that radiates to the back Her abdominal exam is fairly benign and her vital signs are stable We will check CMP, CBC, lipase today Discussed bland diet, GI rest, tramadol sparingly for pain Encouraged her to hydrate well Do not suspect she needs stool studies at this time as she is having some normal stools as well and her complaints are mostly upper GI rather than lower

## 2022-02-05 NOTE — Assessment & Plan Note (Signed)
Previously mildly elevated and stable She has had CT scan in 2021 that showed fatty liver changes, which is likely the culprit We will recheck today

## 2022-02-05 NOTE — Assessment & Plan Note (Signed)
Recurrent issue, uncontrolled She was previously on Zoloft and tolerated it fairly well No SI/HI Will Start Zoloft 50 mg daily Discussed potential side effects Discussed that it can take 6-8 weeks to reach full efficacy Contracted for safety - no SI/HI Discussed synergistic effects of medications and therapy  At follow-up in 2 months, repeat PHQ-9 and consider dose titration

## 2022-02-06 ENCOUNTER — Telehealth: Payer: Self-pay

## 2022-02-06 ENCOUNTER — Encounter: Payer: Self-pay | Admitting: Family Medicine

## 2022-02-06 LAB — CBC
Hematocrit: 39.6 % (ref 34.0–46.6)
Hemoglobin: 13.3 g/dL (ref 11.1–15.9)
MCH: 29.4 pg (ref 26.6–33.0)
MCHC: 33.6 g/dL (ref 31.5–35.7)
MCV: 88 fL (ref 79–97)
Platelets: 369 10*3/uL (ref 150–450)
RBC: 4.52 x10E6/uL (ref 3.77–5.28)
RDW: 12.2 % (ref 11.7–15.4)
WBC: 8.3 10*3/uL (ref 3.4–10.8)

## 2022-02-06 LAB — COMPREHENSIVE METABOLIC PANEL
ALT: 42 IU/L — ABNORMAL HIGH (ref 0–32)
AST: 30 IU/L (ref 0–40)
Albumin/Globulin Ratio: 1.9 (ref 1.2–2.2)
Albumin: 5.1 g/dL — ABNORMAL HIGH (ref 3.9–5.0)
Alkaline Phosphatase: 68 IU/L (ref 44–121)
BUN/Creatinine Ratio: 15 (ref 9–23)
BUN: 9 mg/dL (ref 6–20)
Bilirubin Total: 1.3 mg/dL — ABNORMAL HIGH (ref 0.0–1.2)
CO2: 22 mmol/L (ref 20–29)
Calcium: 9.7 mg/dL (ref 8.7–10.2)
Chloride: 102 mmol/L (ref 96–106)
Creatinine, Ser: 0.62 mg/dL (ref 0.57–1.00)
Globulin, Total: 2.7 g/dL (ref 1.5–4.5)
Glucose: 94 mg/dL (ref 70–99)
Potassium: 3.6 mmol/L (ref 3.5–5.2)
Sodium: 140 mmol/L (ref 134–144)
Total Protein: 7.8 g/dL (ref 6.0–8.5)
eGFR: 127 mL/min/{1.73_m2} (ref >59)

## 2022-02-06 LAB — LIPASE: Lipase: 31 U/L (ref 14–72)

## 2022-02-06 NOTE — Telephone Encounter (Unsigned)
Copied from [place] [phone]. Topic: General - Call Back - No Documentation >> Feb 06, 2022 12:44 PM Ja-Kwan M wrote: Reason for CRM: Pt stated she just had a missed call from the office so she was returning the call. Pt requests call back.

## 2022-02-06 NOTE — Telephone Encounter (Signed)
Pt called back asking who called her earlier this morning, please advise what the call was regarding.

## 2022-02-09 NOTE — Telephone Encounter (Signed)
Message was sent to patient through mychart

## 2022-02-24 ENCOUNTER — Ambulatory Visit: Payer: Self-pay | Admitting: *Deleted

## 2022-02-24 ENCOUNTER — Encounter: Payer: Self-pay | Admitting: Family Medicine

## 2022-02-24 ENCOUNTER — Ambulatory Visit: Payer: Self-pay | Admitting: Family Medicine

## 2022-02-24 ENCOUNTER — Ambulatory Visit
Admission: RE | Admit: 2022-02-24 | Discharge: 2022-02-24 | Disposition: A | Discharge status: Home / Self Care | Payer: Self-pay | Source: Ambulatory Visit | Attending: Family Medicine | Admitting: Family Medicine

## 2022-02-24 VITALS — BP 106/66 | HR 71 | Resp 16 | Wt 181.2 lb

## 2022-02-24 DIAGNOSIS — R350 Frequency of micturition: Secondary | ICD-10-CM

## 2022-02-24 DIAGNOSIS — R1011 Right upper quadrant pain: Secondary | ICD-10-CM

## 2022-02-24 DIAGNOSIS — R1012 Left upper quadrant pain: Secondary | ICD-10-CM

## 2022-02-24 LAB — POCT URINALYSIS DIPSTICK
Bilirubin, UA: NEGATIVE
Glucose, UA: NEGATIVE
Leukocytes, UA: NEGATIVE
Nitrite, UA: NEGATIVE
Protein, UA: NEGATIVE
Spec Grav, UA: 1.01 (ref 1.010–1.025)
Urobilinogen, UA: 0.2 E.U./dL
pH, UA: 6 (ref 5.0–8.0)

## 2022-02-24 LAB — POCT URINE PREGNANCY: Preg Test, Ur: NEGATIVE

## 2022-02-24 MED ORDER — SUCRALFATE 1 G PO TABS: 1.0000 g | ORAL_TABLET | Freq: Three times a day (TID) | ORAL | 0 refills | Status: DC

## 2022-02-26 ENCOUNTER — Encounter: Payer: Self-pay | Admitting: Family Medicine

## 2022-02-27 NOTE — Telephone Encounter (Signed)
Will forward to Dr Ky Barban to respond on Monday for f/u from her visit.

## 2022-03-01 LAB — STOOL CULTURE: E coli, Shiga toxin Assay: NEGATIVE

## 2022-03-01 LAB — H. PYLORI ANTIGEN, STOOL: H pylori Ag, Stl: NEGATIVE

## 2022-03-25 ENCOUNTER — Encounter: Payer: Self-pay | Admitting: Family Medicine

## 2022-03-25 DIAGNOSIS — K2100 Gastro-esophageal reflux disease with esophagitis, without bleeding: Secondary | ICD-10-CM

## 2022-03-25 NOTE — Telephone Encounter (Signed)
Please review.  KP

## 2022-03-26 MED ORDER — ONDANSETRON HCL 4 MG PO TABS
4.0000 mg | ORAL_TABLET | Freq: Three times a day (TID) | ORAL | 0 refills | Status: DC | PRN
Start: 2022-03-26 — End: 2022-10-02

## 2022-03-26 MED ORDER — OMEPRAZOLE 40 MG PO CPDR: 40.0000 mg | DELAYED_RELEASE_CAPSULE | Freq: Every day | ORAL | 1 refills | Status: DC

## 2022-03-30 ENCOUNTER — Ambulatory Visit: Payer: Self-pay | Admitting: Family Medicine

## 2022-04-08 ENCOUNTER — Encounter: Payer: Self-pay | Admitting: Family Medicine

## 2022-04-08 ENCOUNTER — Ambulatory Visit: Payer: Self-pay | Admitting: Family Medicine

## 2022-04-08 VITALS — BP 116/75 | HR 68 | Temp 98.3°F | Resp 16 | Wt 177.7 lb

## 2022-04-08 DIAGNOSIS — R1012 Left upper quadrant pain: Secondary | ICD-10-CM

## 2022-04-08 DIAGNOSIS — K2100 Gastro-esophageal reflux disease with esophagitis, without bleeding: Secondary | ICD-10-CM

## 2022-04-08 MED ORDER — ESCITALOPRAM OXALATE 10 MG PO TABS: 10.0000 mg | ORAL_TABLET | Freq: Every day | ORAL | 0 refills | Status: DC

## 2022-04-08 MED ORDER — OMEPRAZOLE 40 MG PO CPDR: 40.0000 mg | DELAYED_RELEASE_CAPSULE | Freq: Two times a day (BID) | ORAL | 1 refills | Status: DC

## 2022-04-08 NOTE — Progress Notes (Signed)
    SUBJECTIVE:   CHIEF COMPLAINT / HPI:   ABDOMINAL ISSUES - seen previously 6/8 and 6/27 for same. Labs notable for elevated LFT. H pylori negative. UA, Lipase unremarkable. Stool culture negative. - RUQ [place]  with hepatic steatosis, otherwise unremarkable. - on PPI daily, tried carafate at last visit but didn't notice effect - nausea worse, now daily, worse in mornings and evenings.  - sometimes forgets zoloft, usually about twice per week. Feels she is more nauseous when she takes - does better with eating smaller meals.  - is having early satiety.    Duration: months Nature: ill defined, stab, ache Location: b/l upper quadrants, L>R  Radiation: yes Frequency: intermittent, daily Alleviating factors: smaller portions, zofran Aggravating factors: none  Treatments attempted: PPI, carafate, zofran Constipation: intermittent Diarrhea:  intermittent Heartburn: no Bloating:no Nausea: yes Vomiting: no Melena or hematochezia:  two episodes of red blood mixed with stool  about a month ago Rash: no Jaundice: no Fever: no Weight loss: yes   OBJECTIVE:   BP 116/75 (BP Location: Left Arm, Patient Position: Sitting, Cuff Size: Large)   Pulse 68   Temp 98.3 F (36.8 C) (Oral)   Resp 16   Wt 177 lb 11.2 oz (80.6 kg)   LMP 03/11/2022 (Approximate)   BMI 30.50 kg/m   Gen: well appearing, in NAD Card: Reg rate Lungs: Comfortable WOB on RA Ext: WWP, no edema   ASSESSMENT/PLAN:   Left upper quadrant abdominal pain Bilateral UQ pain, L>R. Intermittent and worsening. Unclear etiology with unremarkable work up thus far including H pylori, CMP, CBC, lipase, stool studies, UA, RUQ [place]. Prior CT A/P in 2021 for similar symptoms unremarkable. Only notable is mildly elevated LFTs attributable to hepatic steatosis and improving on last recheck. Noticing worsened nausea on current SSRI, will change to lexapro. With early satiety and ~20lb unintentional weight loss in 6 months, refer to GI  for evaluation. Patient elects to wait until the end of the month for referral as she currently does not have insurance. F/u in 2 months for recheck and MDD/GAD.     Myles Gip, DO

## 2022-04-08 NOTE — Assessment & Plan Note (Addendum)
Bilateral UQ pain, L>R. Intermittent and worsening. Unclear etiology with unremarkable work up thus far including H pylori, CMP, CBC, lipase, stool studies, UA, RUQ [place]. Prior CT A/P in 2021 for similar symptoms unremarkable. Only notable is mildly elevated LFTs attributable to hepatic steatosis and improving on last recheck. Noticing worsened nausea on current SSRI, will change to lexapro. With early satiety and ~20lb unintentional weight loss in 6 months, refer to GI for evaluation. Patient elects to wait until the end of the month for referral as she currently does not have insurance. F/u in 2 months for recheck and MDD/GAD.

## 2022-04-14 ENCOUNTER — Encounter: Payer: Self-pay | Admitting: Family Medicine

## 2022-05-13 ENCOUNTER — Telehealth: Payer: Self-pay | Admitting: Family Medicine

## 2022-09-01 ENCOUNTER — Other Ambulatory Visit: Payer: Self-pay

## 2022-09-01 ENCOUNTER — Ambulatory Visit
Admission: EM | Admit: 2022-09-01 | Discharge: 2022-09-01 | Disposition: A | Discharge status: Home / Self Care | Payer: Self-pay | Attending: Physician Assistant | Admitting: Physician Assistant

## 2022-09-01 ENCOUNTER — Ambulatory Visit (INDEPENDENT_AMBULATORY_CARE_PROVIDER_SITE_OTHER): Payer: Self-pay

## 2022-09-01 ENCOUNTER — Ambulatory Visit: Payer: Self-pay

## 2022-09-01 DIAGNOSIS — R079 Chest pain, unspecified: Secondary | ICD-10-CM

## 2022-09-01 DIAGNOSIS — R002 Palpitations: Secondary | ICD-10-CM | POA: Insufficient documentation

## 2022-09-01 DIAGNOSIS — R030 Elevated blood-pressure reading, without diagnosis of hypertension: Secondary | ICD-10-CM | POA: Insufficient documentation

## 2022-09-01 LAB — COMPREHENSIVE METABOLIC PANEL
ALT: 36 U/L (ref 0–44)
AST: 27 U/L (ref 15–41)
Albumin: 4.5 g/dL (ref 3.5–5.0)
Alkaline Phosphatase: 53 U/L (ref 38–126)
Anion gap: 12 (ref 5–15)
BUN: 12 mg/dL (ref 6–20)
CO2: 21 mmol/L — ABNORMAL LOW (ref 22–32)
Calcium: 9 mg/dL (ref 8.9–10.3)
Chloride: 104 mmol/L (ref 98–111)
Creatinine, Ser: 0.44 mg/dL (ref 0.44–1.00)
GFR, Estimated: >60 mL/min (ref >60)
Glucose, Bld: 87 mg/dL (ref 70–99)
Potassium: 3.5 mmol/L (ref 3.5–5.1)
Sodium: 137 mmol/L (ref 135–145)
Total Bilirubin: 1.4 mg/dL — ABNORMAL HIGH (ref 0.3–1.2)
Total Protein: 8.1 g/dL (ref 6.5–8.1)

## 2022-09-01 LAB — CBC WITH DIFFERENTIAL/PLATELET
Abs Immature Granulocytes: 0.05 10*3/uL (ref 0.00–0.07)
Basophils Absolute: 0.1 10*3/uL (ref 0.0–0.1)
Basophils Relative: 1 %
Eosinophils Absolute: 0.2 10*3/uL (ref 0.0–0.5)
Eosinophils Relative: 2 %
HCT: 40.5 % (ref 36.0–46.0)
Hemoglobin: 13.7 g/dL (ref 12.0–15.0)
Immature Granulocytes: 1 %
Lymphocytes Relative: 33 %
Lymphs Abs: 3.3 10*3/uL (ref 0.7–4.0)
MCH: 29.6 pg (ref 26.0–34.0)
MCHC: 33.8 g/dL (ref 30.0–36.0)
MCV: 87.5 fL (ref 80.0–100.0)
Monocytes Absolute: 0.7 10*3/uL (ref 0.1–1.0)
Monocytes Relative: 7 %
Neutro Abs: 5.8 10*3/uL (ref 1.7–7.7)
Neutrophils Relative %: 56 %
Platelets: 341 10*3/uL (ref 150–400)
RBC: 4.63 MIL/uL (ref 3.87–5.11)
RDW: 12.5 % (ref 11.5–15.5)
WBC: 10.1 10*3/uL (ref 4.0–10.5)
nRBC: 0 % (ref 0.0–0.2)

## 2022-09-01 LAB — TROPONIN I (HIGH SENSITIVITY): Troponin I (High Sensitivity): 2 ng/L (ref <18)

## 2022-09-01 NOTE — Telephone Encounter (Signed)
  Chief Complaint: chest pain Symptoms: palpitations and SOB at first then experienced squeezing feeling and heaviness Frequency: 2 weeks  Pertinent Negatives: Patient denies sx present right now Disposition: '[x]'$ ED /'[]'$ Urgent Care (no appt availability in office) / '[]'$ Appointment(In office/virtual)/ '[]'$  [place] [facility]/ '[]'$ Home Care/ '[]'$ Refused Recommended Disposition /'[]'$ [place] [facility]/ '[]'$  Follow-up with PCP Additional Notes: pt states that at first when she noticed sx it was like heart skipping beats and got SOB but then last week has changed to squeezing sensation in chest and then has heaviness feeling on L chest/breast area into armpit, occurred x 3 yesterday. Pt was wanting to make appt but advised going to ED would be best to evaluate and do more testing than practice. Pt verbalized understanding.   Reason for Disposition  [1] Chest pain lasts > 5 minutes AND [2] occurred in past 3 days (72 hours) (Exception: Feels exactly the same as previously diagnosed heartburn and has accompanying sour taste in mouth.)  Answer Assessment - Initial Assessment Questions 1. LOCATION: "Where does it hurt?"       L chest and breast area 3. ONSET: "When did the chest pain begin?" (Minutes, hours or days)      2 weeks 4. PATTERN: "Does the pain come and go, or has it been constant since it started?"  "Does it get worse with exertion?"      Comes and goes 6. SEVERITY: "How bad is the pain?"  (e.g., Scale 1-10; mild, moderate, or severe)    - MILD (1-3): doesn't interfere with normal activities     - MODERATE (4-7): interferes with normal activities or awakens from sleep    - SEVERE (8-10): excruciating pain, unable to do any normal activities       8/10 10. OTHER SYMPTOMS: "Do you have any other symptoms?" (e.g., dizziness, nausea, vomiting, sweating, fever, difficulty breathing, cough)       SOB at first and then has a squeezing feeling, heaviness on L breast and armpit  Protocols used: Chest  Pain-[facility]

## 2022-09-01 NOTE — Discharge Instructions (Signed)
-  Your workup here today has been very reassuring.  Your chest x-ray does not show any concerns and your EKG does not either.  The labs we performed today show a normal heart enzyme and normal kidney liver function as well as electrolytes and you are not anemic. - We discussed the possibility that you could have an arrhythmia that is coming and going severe symptoms continue that something you should follow-up with your PCP about.  You may need to wear a Holter monitor or Zio patch or receive a referral to cardiology. - At this time I would try to rest and consider taking Tylenol and using low doses of ibuprofen to treat your symptoms. - If your chest pain gets worse or you have any associated dizziness, weakness, shortness of breath, feel faint or pass out, have numbness or tingling or radiation of pain all the way down your arm, severe headaches, vision changes, etc. you should call 911 or go to ER.

## 2022-09-01 NOTE — ED Triage Notes (Signed)
Pt reports chest pain that starteyd x 2 weeks. Feels a squeezing feeling in her chest that happens on and off . Also a skipping a beat feeling. Says that her chest feels heavy. Constant pain under her left arm pit to breast area.   Took aspirin yesterday but no relief.

## 2022-09-01 NOTE — ED Provider Notes (Signed)
[facility]    CSN: [id number] Arrival date & time: 09/01/22  1417      History   Chief Complaint No chief complaint on file.   HPI Katrina Bryant is a 27 y.o. female presenting for 2-week history of intermittent left-sided chest pain.  Patient says she only experienced it a couple times in the past couple weeks but yesterday she had 3 episodes of squeezing and sharp pain that lasted for couple minutes and went away.  Today and ever since then she reports that she has had a very mild dull ache in the left side of her chest.  She says nothing seems to make the pain better or worse.  She has taken aspirin without relief.  She denies any radiation of pain, numbness, weakness or tingling.  She reports some palpitations and feeling like her heart is skipping a beat at times.  She is not feeling that sensation at this time.  Not reporting any syncope, headaches, dizziness, weakness.  She says she has had this pain in the past but seem to go away on its own.  She says she has never had it last as long as it has this time.  She has no history of any heart problems.  She does have a history of anxiety but she says is well-controlled as well as GERD.  She denies any abdominal pain, nausea or vomiting.  She reports history of cough and congestion last month.  States symptoms have been gone for about the last 10 days.  Patient says she contacted her PCP office and they advised her to be seen emergently today.  HPI  Past Medical History:  Diagnosis Date   Allergy    Anxiety    Breast pain    Colitis    Depression    Diffuse abdominal pain 04/22/2021   Irregular menstrual cycle    Nausea and vomiting 04/22/2021   Pelvic pain     Patient Active Problem List   Diagnosis Date Noted   Onychomycosis 02/05/2022   Moderate episode of recurrent major depressive disorder ([place]) 02/05/2022   Left upper quadrant abdominal pain 01/28/2022   Gastroesophageal reflux disease with esophagitis  without hemorrhage 01/28/2022   Obesity (BMI 30.0-34.9) 01/28/2022   Non-seasonal allergic rhinitis due to pollen 01/28/2022   Elevated LFTs 05/24/2018    Past Surgical History:  Procedure Laterality Date   BREAST BIOPSY Right 03/08/2018   Affirm Bx-  X clip INTRADUCTAL PAPILLOMA.    COLONOSCOPY  03/02/2018    OB History     Gravida  0   Para  0   Term  0   Preterm  0   AB  0   Living  0      SAB  0   IAB  0   Ectopic  0   Multiple  0   Live Births  0        Obstetric Comments  Menstrual age: 69            Home Medications    Prior to Admission medications   Medication Sig Start Date End Date Taking? Authorizing Provider  cetirizine (ZYRTEC) 10 MG tablet Take 1 tablet (10 mg total) by mouth daily. Patient not taking: Reported on 02/24/2022 01/28/22   Gwyneth Sprout, FNP  escitalopram (LEXAPRO) 10 MG tablet Take 1 tablet (10 mg total) by mouth daily. 04/08/22   Myles Gip, DO  fluticasone (FLONASE) 50 MCG/ACT nasal spray Place 2  sprays into both nostrils daily. Patient not taking: Reported on 02/24/2022 01/28/22   Gwyneth Sprout, FNP  omeprazole (PRILOSEC) 40 MG capsule Take 1 capsule (40 mg total) by mouth 2 (two) times daily. 04/08/22 07/07/22  Myles Gip, DO  ondansetron (ZOFRAN) 4 MG tablet Take 1 tablet (4 mg total) by mouth every 8 (eight) hours as needed for nausea or vomiting. 03/26/22   Myles Gip, DO  sucralfate (CARAFATE) 1 g tablet Take 1 tablet (1 g total) by mouth 4 (four) times daily -  with meals and at bedtime. 02/24/22   Myles Gip, DO    Family History Family History  Problem Relation Age of Onset   Diabetes Father    Hyperlipidemia Father    Hypertension Father    Hypertension Mother    Healthy Sister    Healthy Brother    Breast cancer Neg Hx    Ovarian cancer Neg Hx    Colon cancer Neg Hx    Cervical cancer Neg Hx     Social History Social History   Tobacco Use   Smoking status: Never    Smokeless tobacco: Never  Vaping Use   Vaping Use: Some days  Substance Use Topics   Alcohol use: Never   Drug use: Never     Allergies   Amoxicillin, Bactrim [sulfamethoxazole-trimethoprim], Biaxin [clarithromycin], Cephalosporins, Clindamycin/lincomycin, and Penicillins   Review of Systems Review of Systems  Constitutional:  Negative for diaphoresis, fatigue and fever.  Respiratory:  Negative for cough and shortness of breath.   Cardiovascular:  Positive for chest pain and palpitations. Negative for leg swelling.  Gastrointestinal:  Negative for abdominal pain, nausea and vomiting.  Musculoskeletal:  Negative for back pain.  Neurological:  Negative for dizziness, syncope, weakness, numbness and headaches.  Psychiatric/Behavioral:  The patient is nervous/anxious (not at this time).      Physical Exam Triage Vital Signs ED Triage Vitals  Enc Vitals Group     BP 09/01/22 1513 (!) 148/88     Pulse Rate 09/01/22 1513 78     Resp 09/01/22 1513 18     Temp 09/01/22 1513 98.3 F (36.8 C)     Temp Source 09/01/22 1513 Oral     SpO2 09/01/22 1513 98 %     Weight --      Height --      Head Circumference --      Peak Flow --      Pain Score 09/01/22 1516 8     Pain Loc --      Pain Edu? --      Excl. in [place]? --    No data found.  Updated Vital Signs BP 124/82 (BP Location: Right Arm)   Pulse 68   Temp 98.5 F (36.9 C) (Oral)   Resp 20   LMP 08/27/2022 (Exact Date)   SpO2 98%    Physical Exam Vitals and nursing note reviewed.  Constitutional:      General: She is not in acute distress.    Appearance: Normal appearance. She is not ill-appearing or toxic-appearing.  HENT:     Head: Normocephalic and atraumatic.     Nose: Nose normal.     Mouth/Throat:     Mouth: Mucous membranes are moist.     Pharynx: Oropharynx is clear.  Eyes:     General: No scleral icterus.       Right eye: No discharge.        Left eye: No  discharge.     Conjunctiva/sclera:  Conjunctivae normal.  Cardiovascular:     Rate and Rhythm: Normal rate and regular rhythm.     Heart sounds: Normal heart sounds.  Pulmonary:     Effort: Pulmonary effort is normal. No respiratory distress.     Breath sounds: Normal breath sounds.  Chest:     Chest wall: Tenderness (slight TTP left anterior chest) present.  Musculoskeletal:     Cervical back: Neck supple.  Skin:    General: Skin is dry.  Neurological:     General: No focal deficit present.     Mental Status: She is alert. Mental status is at baseline.     Motor: No weakness.     Gait: Gait normal.  Psychiatric:        Mood and Affect: Mood normal.        Behavior: Behavior normal.        Thought Content: Thought content normal.      UC Treatments / Results  Labs (all labs ordered are listed, but only abnormal results are displayed) Labs Reviewed  COMPREHENSIVE METABOLIC PANEL - Abnormal; Notable for the following components:      Result Value   CO2 21 (*)    Total Bilirubin 1.4 (*)    All other components within normal limits  CBC WITH DIFFERENTIAL/PLATELET  TROPONIN I (HIGH SENSITIVITY)    EKG   Radiology DG Chest 2 View  Result Date: 09/01/2022 CLINICAL DATA:  Left-sided chest pain.  Cough. EXAM: CHEST - 2 VIEW COMPARISON:  Remote radiograph 06/11/2012 FINDINGS: The cardiomediastinal contours are normal. The lungs are clear. Pulmonary vasculature is normal. No consolidation, pleural effusion, or pneumothorax. No acute osseous abnormalities are seen. IMPRESSION: Negative radiographs of the chest. Electronically Signed   By: Keith Rake M.D.   On: 09/01/2022 15:36    Procedures ED EKG  Date/Time: 09/01/2022 4:32 PM  Performed by: Danton Clap, PA-C Authorized by: Danton Clap, PA-C   Interpretation:    Interpretation: normal   Rate:    ECG rate:  65   ECG rate assessment: normal   Rhythm:    Rhythm: sinus rhythm   Ectopy:    Ectopy: none   QRS:    QRS axis:  Normal   QRS  intervals:  Normal   QRS conduction: normal   ST segments:    ST segments:  Normal T waves:    T waves: normal   Comments:     Normal sinus rhythm and regular rate. Short PR.  (including critical care time)  Medications Ordered in UC Medications - No data to display  Initial Impression / Assessment and Plan / UC Course  I have reviewed the triage vital signs and the nursing notes.  Pertinent labs & imaging results that were available during my care of the patient were reviewed by me and considered in my medical decision making (see chart for details).   27 year old female presents for left-sided squeezing chest pain that comes and goes for the past 2 weeks.  It was worse yesterday and she reports 3 episodes yesterday.  Reports a dull aching sensation in her chest ever since then.  Reports occasional palpitations and feeling like she has skipped beats.  No history of cardiopulmonary disease  Reports recent cough and congestion which resolved about 10 days ago.  Blood pressure elevated 148/88.  Other vitals normal and stable and she is overall well-appearing and in no acute distress.  EKG performed today  shows normal sinus rhythm and regular rate of 65 bpm.  Short PR.  On exam she is overall well-appearing.  Does not appear anxious.  She has slight tenderness palpation of the left anterior chest.  Chest clear auscultation heart regular rate and rhythm.  Chest x-ray performed today is within normal limits.  Discussed this with patient.  Will obtain CBC, CMP and troponin.  Normal troponin and essentially normal CBC and CMP.  Discussed all patient's lab results, EKG and chest x-ray with her.  Low suspicion for any acute cardiopulmonary abnormality.  Suspect likely musculoskeletal chest pain.  Advised Tylenol, low-dose NSAID, ice, heat, muscle rubs and follow-up with PCP especially if she continues to feel the "skipped beats."  I thoroughly reviewed going to the emergency department if  she has any worsening chest pain or shortness of breath or if she feels dizzy, weak, etc.   Final Clinical Impressions(s) / UC Diagnoses   Final diagnoses:  Chest pain, unspecified type  Palpitations     Discharge Instructions      -Your workup here today has been very reassuring.  Your chest x-ray does not show any concerns and your EKG does not either.  The labs we performed today show a normal heart enzyme and normal kidney liver function as well as electrolytes and you are not anemic. - We discussed the possibility that you could have an arrhythmia that is coming and going severe symptoms continue that something you should follow-up with your PCP about.  You may need to wear a Holter monitor or Zio patch or receive a referral to cardiology. - At this time I would try to rest and consider taking Tylenol and using low doses of ibuprofen to treat your symptoms. - If your chest pain gets worse or you have any associated dizziness, weakness, shortness of breath, feel faint or pass out, have numbness or tingling or radiation of pain all the way down your arm, severe headaches, vision changes, etc. you should call 911 or go to ER.     ED Prescriptions   None    PDMP not reviewed this encounter.   Danton Clap, PA-C 09/01/22 [phone]

## 2022-09-18 ENCOUNTER — Ambulatory Visit: Payer: Self-pay | Admitting: Family Medicine

## 2022-09-25 ENCOUNTER — Ambulatory Visit: Payer: Self-pay | Admitting: Family Medicine

## 2022-10-01 NOTE — Progress Notes (Signed)
I,Sulibeya S Dimas,acting as a Education administrator for Lavon Paganini, MD.,have documented all relevant documentation on the behalf of Lavon Paganini, MD,as directed by  Lavon Paganini, MD while in the presence of Lavon Paganini, MD.     Established patient visit   Patient: Katrina Bryant   DOB: Dec 19, 1995   27 y.o. Female  MRN: [id number] Visit Date: 10/02/2022  Today's healthcare provider: Lavon Paganini, MD   Chief Complaint  Patient presents with   Palpitations   Subjective    Palpitations  This is a recurrent problem. The current episode started more than 1 month ago. The problem occurs intermittently. The problem has been gradually improving. Associated symptoms include anxiety, chest pain, an irregular heartbeat and shortness of breath. Pertinent negatives include no coughing, diaphoresis, dizziness, malaise/fatigue, nausea, near-syncope, syncope or vomiting. She has tried nothing for the symptoms.   Follow up for chest pain and palpitations  The patient was last seen for this 1 months ago at urgent care. Changes made at last visit include Tylenol, cold compressions for pain.  She reports excellent compliance with treatment. She feels that condition is Improved. She is not having side effects.    Less frequent than  previously. First started while at work, but occur randomly. Started at the end of 2023.  NSR EKG at UC ----------------------------------------------------------------------------------------- Patient reports she stopped taking all her medication a few months ago. Had been a month or so before noticing the palpitations. Was not able to keep having f/u's and stay on meds so decided to stop   Noticed darker spots on L areola. Has occasional pain in L breast that extends into axilla.   Medications: Outpatient Medications Prior to Visit  Medication Sig   [DISCONTINUED] cetirizine (ZYRTEC) 10 MG tablet Take 1 tablet (10 mg total) by mouth daily.  (Patient not taking: Reported on 02/24/2022)   [DISCONTINUED] escitalopram (LEXAPRO) 10 MG tablet Take 1 tablet (10 mg total) by mouth daily. (Patient not taking: Reported on 10/02/2022)   [DISCONTINUED] fluticasone (FLONASE) 50 MCG/ACT nasal spray Place 2 sprays into both nostrils daily. (Patient not taking: Reported on 02/24/2022)   [DISCONTINUED] omeprazole (PRILOSEC) 40 MG capsule Take 1 capsule (40 mg total) by mouth 2 (two) times daily.   [DISCONTINUED] ondansetron (ZOFRAN) 4 MG tablet Take 1 tablet (4 mg total) by mouth every 8 (eight) hours as needed for nausea or vomiting. (Patient not taking: Reported on 10/02/2022)   [DISCONTINUED] sucralfate (CARAFATE) 1 g tablet Take 1 tablet (1 g total) by mouth 4 (four) times daily -  with meals and at bedtime. (Patient not taking: Reported on 10/02/2022)   No facility-administered medications prior to visit.    Review of Systems  Constitutional:  Negative for diaphoresis and malaise/fatigue.  Respiratory:  Positive for shortness of breath. Negative for cough.   Cardiovascular:  Positive for chest pain and palpitations. Negative for syncope and near-syncope.  Gastrointestinal:  Negative for nausea and vomiting.  Neurological:  Negative for dizziness.  Psychiatric/Behavioral:  The patient is nervous/anxious.        Objective    BP 123/82 (BP Location: Left Arm, Patient Position: Sitting, Cuff Size: Large)   Pulse 67   Temp 98.3 F (36.8 C) (Temporal)   Resp 16   Ht '5\' 4"'$  (1.626 m)   Wt 184 lb (83.5 kg)   LMP 09/07/2022 (Approximate)   BMI 31.58 kg/m    Physical Exam Vitals reviewed.  Constitutional:      General: She is not in acute  distress.    Appearance: Normal appearance. She is well-developed. She is not diaphoretic.  HENT:     Head: Normocephalic and atraumatic.  Eyes:     General: No scleral icterus.    Conjunctiva/sclera: Conjunctivae normal.  Neck:     Thyroid: No thyromegaly.  Cardiovascular:     Rate and Rhythm: Normal  rate and regular rhythm.     Heart sounds: Normal heart sounds. No murmur heard. Pulmonary:     Effort: Pulmonary effort is normal. No respiratory distress.     Breath sounds: Normal breath sounds. No wheezing, rhonchi or rales.  Chest:     Comments: Breasts: breasts appear normal, no suspicious masses, no skin or nipple changes or axillary nodes  Musculoskeletal:     Cervical back: Neck supple.     Right lower leg: No edema.     Left lower leg: No edema.  Lymphadenopathy:     Cervical: No cervical adenopathy.  Skin:    General: Skin is warm and dry.     Findings: No rash.  Neurological:     Mental Status: She is alert and oriented to person, place, and time. Mental status is at baseline.  Psychiatric:        Mood and Affect: Mood normal.        Behavior: Behavior normal.       No results found for any visits on 10/02/22.  Assessment & Plan     Problem List Items Addressed This Visit       Other   Breast pain, left    Benign exam  Reassurance given      Palpitations - Primary    New problem EKG from UC reviewed - NSR Discussed possible etiologies Will check TSH to r/o hyperthyroidism Discussed possibility of doing Zio patch - patient will check on cost and let me know if she'd like to proceed Discussed that it is probably benign etiology given age and lack of other comorbidities in setting of known anxiety Advised decreased stress and caffeine and increased sleep      Relevant Orders   TSH     Return if symptoms worsen or fail to improve.      I, Lavon Paganini, MD, have reviewed all documentation for this visit. The documentation on 10/02/22 for the exam, diagnosis, procedures, and orders are all accurate and complete.   Jamela Cumbo, Dionne Bucy, MD, MPH [facility]

## 2022-10-02 ENCOUNTER — Ambulatory Visit: Payer: Self-pay | Admitting: Family Medicine

## 2022-10-02 ENCOUNTER — Encounter: Payer: Self-pay | Admitting: Family Medicine

## 2022-10-02 VITALS — BP 123/82 | HR 67 | Temp 98.3°F | Resp 16 | Ht 64.0 in | Wt 184.0 lb

## 2022-10-02 DIAGNOSIS — N644 Mastodynia: Secondary | ICD-10-CM

## 2022-10-02 DIAGNOSIS — R002 Palpitations: Secondary | ICD-10-CM

## 2022-10-02 NOTE — Assessment & Plan Note (Signed)
New problem EKG from UC reviewed - NSR Discussed possible etiologies Will check TSH to r/o hyperthyroidism Discussed possibility of doing Zio patch - patient will check on cost and let me know if she'd like to proceed Discussed that it is probably benign etiology given age and lack of other comorbidities in setting of known anxiety Advised decreased stress and caffeine and increased sleep

## 2022-10-02 NOTE — Assessment & Plan Note (Signed)
Benign exam  Reassurance given

## 2022-10-03 LAB — TSH: TSH: 1.55 u[IU]/mL (ref 0.450–4.500)

## 2022-12-01 ENCOUNTER — Ambulatory Visit
Admission: EM | Admit: 2022-12-01 | Discharge: 2022-12-01 | Disposition: A | Discharge status: Home / Self Care | Payer: Self-pay

## 2022-12-01 DIAGNOSIS — L918 Other hypertrophic disorders of the skin: Secondary | ICD-10-CM

## 2022-12-01 NOTE — ED Provider Notes (Addendum)
[facility]    CSN: [id number] Arrival date & time: 12/01/22  1827      History   Chief Complaint Chief Complaint  Patient presents with   Skin Tag    HPI Katrina Bryant is a 27 y.o. female.   HPI  27 year old female with past medical history of GERD and palpitations presents for evaluation of skin tag in her right groin.  She states that she noticed a skin tag about a year ago but today it became sore and started to bleed.  Patient is requesting evaluation and removal.  Past Medical History:  Diagnosis Date   Allergy    Anxiety    Breast pain    Colitis    Depression    Diffuse abdominal pain 04/22/2021   Irregular menstrual cycle    Nausea and vomiting 04/22/2021   Pelvic pain     Patient Active Problem List   Diagnosis Date Noted   Palpitations 10/02/2022   Onychomycosis 02/05/2022   Moderate episode of recurrent major depressive disorder 02/05/2022   Left upper quadrant abdominal pain 01/28/2022   Gastroesophageal reflux disease with esophagitis without hemorrhage 01/28/2022   Obesity (BMI 30.0-34.9) 01/28/2022   Non-seasonal allergic rhinitis due to pollen 01/28/2022   Elevated LFTs 05/24/2018   Breast pain, left 04/13/2018    Past Surgical History:  Procedure Laterality Date   BREAST BIOPSY Right 03/08/2018   Affirm Bx-  X clip INTRADUCTAL PAPILLOMA.    COLONOSCOPY  03/02/2018    OB History     Gravida  0   Para  0   Term  0   Preterm  0   AB  0   Living  0      SAB  0   IAB  0   Ectopic  0   Multiple  0   Live Births  0        Obstetric Comments  Menstrual age: 89            Home Medications    Prior to Admission medications   Not on File    Family History Family History  Problem Relation Age of Onset   Diabetes Father    Hyperlipidemia Father    Hypertension Father    Hypertension Mother    Healthy Sister    Healthy Brother    Breast cancer Neg Hx    Ovarian cancer Neg Hx    Colon  cancer Neg Hx    Cervical cancer Neg Hx     Social History Social History   Tobacco Use   Smoking status: Never   Smokeless tobacco: Never  Vaping Use   Vaping Use: Some days  Substance Use Topics   Alcohol use: Never   Drug use: Never     Allergies   Amoxicillin, Bactrim [sulfamethoxazole-trimethoprim], Biaxin [clarithromycin], Cephalosporins, Clindamycin/lincomycin, and Penicillins   Review of Systems Review of Systems  Skin:        Skin tag in right groin.     Physical Exam Triage Vital Signs ED Triage Vitals  Enc Vitals Group     BP 12/01/22 1901 (!) 142/95     Pulse Rate 12/01/22 1901 74     Resp --      Temp 12/01/22 1901 98.7 F (37.1 C)     Temp Source 12/01/22 1901 Oral     SpO2 12/01/22 1901 100 %     Weight 12/01/22 1900 185 lb (83.9 kg)     Height  12/01/22 1900 5\' 4"  (1.626 m)     Head Circumference --      Peak Flow --      Pain Score 12/01/22 1900 0     Pain Loc --      Pain Edu? --      Excl. in [place]? --    No data found.  Updated Vital Signs BP (!) 142/95 (BP Location: Left Arm)   Pulse 74   Temp 98.7 F (37.1 C) (Oral)   Ht 5\' 4"  (1.626 m)   Wt 185 lb (83.9 kg)   LMP 10/31/2022 (Approximate)   SpO2 100%   BMI 31.76 kg/m   Visual Acuity Right Eye Distance:   Left Eye Distance:   Bilateral Distance:    Right Eye Near:   Left Eye Near:    Bilateral Near:     Physical Exam Vitals and nursing note reviewed. Exam conducted with a chaperone present Janeal Holmes, RN).  Constitutional:      Appearance: Normal appearance. She is not ill-appearing.  Skin:    General: Skin is warm and dry.     Capillary Refill: Capillary refill takes less than 2 seconds.     Findings: Lesion present.  Neurological:     General: No focal deficit present.     Mental Status: She is alert.      UC Treatments / Results  Labs (all labs ordered are listed, but only abnormal results are displayed) Labs Reviewed - No data to  display  EKG   Radiology No results found.  Procedures Procedures (including critical care time)  Medications Ordered in UC Medications - No data to display  Initial Impression / Assessment and Plan / UC Course  I have reviewed the triage vital signs and the nursing notes.  Pertinent labs & imaging results that were available during my care of the patient were reviewed by me and considered in my medical decision making (see chart for details).   Patient is a pleasant, nontoxic-appearing 27 year old female presenting for evaluation of a possible skin tag in her right groin.  The skin tag is sitting right in her thigh crease on her bikini line where her underwear rests.  She states that the started bleeding today and has become irritated and she is requesting removal.  On exam the patient does have a small skin tag in that area.  I cleansed the area with alcohol, anesthetized with 1 mL of 1% lidocaine without epi, and once good anesthesia was achieved using a pair of tweezers and an 11 blade excised the skin tag.  There is very minor bleeding that was arrested with silver nitrate stick.  I will discharge the patient home with return precautions and infection control precautions.   Final Clinical Impressions(s) / UC Diagnoses   Final diagnoses:  Skin tag     Discharge Instructions      The area may continue to be sore for a day or so.  You can clean it as normal during your shower.  You do not need to apply any topical antibiotic ointment.  If you develop any redness, swelling, or tenderness please return for reevaluation or see your primary care provider.     ED Prescriptions   None    PDMP not reviewed this encounter.   Margarette Canada, NP 12/01/22 1945    Margarette Canada, NP 12/01/22 1946

## 2022-12-01 NOTE — ED Triage Notes (Signed)
Pt presents to UC c/o possible skin tag RT groin. Pt states she noticed it bleeding today.

## 2022-12-01 NOTE — Discharge Instructions (Addendum)
The area may continue to be sore for a day or so.  You can clean it as normal during your shower.  You do not need to apply any topical antibiotic ointment.  If you develop any redness, swelling, or tenderness please return for reevaluation or see your primary care provider.

## 2022-12-05 ENCOUNTER — Telehealth: Payer: Self-pay

## 2022-12-05 NOTE — Telephone Encounter (Addendum)
Received call from pt in regards to skin tag that was removed on 4/2 by Becky Augusta NP. Pt was starting to notice black coloration in general area where skin tag was removed. S/w Riki Rusk NP in regards to next steps & he stated that this could be caused from the silver nitrate that was applied in order to remove the tag & for her to cleanse area really well & to monitor for the next few days for any redness,warmness or discharge. Informed pt in regards to next steps & verbalized understanding to info.

## 2023-01-05 ENCOUNTER — Encounter: Payer: Self-pay | Admitting: Physician Assistant

## 2023-01-05 ENCOUNTER — Ambulatory Visit: Payer: Self-pay | Admitting: *Deleted

## 2023-01-05 ENCOUNTER — Ambulatory Visit (INDEPENDENT_AMBULATORY_CARE_PROVIDER_SITE_OTHER): Payer: Self-pay | Admitting: Physician Assistant

## 2023-01-05 VITALS — BP 131/93 | HR 67 | Ht 64.0 in | Wt 187.0 lb

## 2023-01-05 DIAGNOSIS — M5441 Lumbago with sciatica, right side: Secondary | ICD-10-CM

## 2023-01-05 DIAGNOSIS — M5442 Lumbago with sciatica, left side: Secondary | ICD-10-CM

## 2023-01-05 DIAGNOSIS — M25561 Pain in right knee: Secondary | ICD-10-CM

## 2023-01-05 MED ORDER — METHYLPREDNISOLONE 4 MG PO TBPK: ORAL_TABLET | ORAL | 0 refills | Status: DC

## 2023-01-05 NOTE — Telephone Encounter (Signed)
Reason for Disposition  [1] MODERATE pain (e.g., interferes with normal activities, limping) AND [2] present > 3 days  Answer Assessment - Initial Assessment Questions 1. LOCATION and RADIATION: "Where is the pain located?"      R knee- 4/20, had been on feet all day the day previous- seems to be getting owrse 2. QUALITY: "What does the pain feel like?"  (e.g., sharp, dull, aching, burning)     Pressure- pain radiate down to foot and into hip 3. SEVERITY: "How bad is the pain?" "What does it keep you from doing?"   (Scale 1-10; or mild, moderate, severe)   -  MILD (1-3): doesn't interfere with normal activities    -  MODERATE (4-7): interferes with normal activities (e.g., work or school) or awakens from sleep, limping    -  SEVERE (8-10): excruciating pain, unable to do any normal activities, unable to walk     moderate 4. ONSET: "When did the pain start?" "Does it come and go, or is it there all the time?"     3 weeks 5. RECURRENT: "Have you had this pain before?" If Yes, ask: "When, and what happened then?"     Mid March patient fell at store- got better- did have a day of pain after- 2 weeks ago she had fall at home 6. SETTING: "Has there been any recent work, exercise or other activity that involved that part of the body?"      2 recent falls 7. AGGRAVATING FACTORS: "What makes the knee pain worse?" (e.g., walking, climbing stairs, running)     Pain either way- sitting or standing- most relief  8. ASSOCIATED SYMPTOMS: "Is there any swelling or redness of the knee?"     R knee slightly lower and possibly swollen  Protocols used: Knee Pain-A-AH

## 2023-01-05 NOTE — Progress Notes (Unsigned)
Established patient visit   Patient: Katrina Bryant   DOB: 01-Jun-1996   27 y.o. Female  MRN: [id number] Visit Date: 01/05/2023  Today's healthcare provider: Alfredia Ferguson, PA-C   Chief Complaint  Patient presents with   Knee Injury    Re-current injury--frontal right knee pain radiated lower, numbness to toe/lower back, especially when sitting or driving.--3 weeks. Tried icing, heat, resting and ibuprofen. Tried Also, left knee start having pain-   Subjective    HPI Pt reports she fell in [employer] and landed on her knee in mid march, and since then has had discomfort across the front of her right knee when sitting ,walking ,driving. Relief when lying flat. She reports a few weeks ago falling in her bedroom and fell back against her bedframe. Since then her back has hurt, midline w/ radiation down b/l legs with occasional numbness. She has managed with prn ibuprofen and tylenol.   Fell [employer] mid march; 2 weeks ago; [employer] slipped on right knee;  Medications: No outpatient medications prior to visit.   No facility-administered medications prior to visit.   Review of Systems  Constitutional:  Negative for fatigue and fever.  Respiratory:  Negative for cough and shortness of breath.   Cardiovascular:  Negative for chest pain and leg swelling.  Gastrointestinal:  Negative for abdominal pain.  Musculoskeletal:  Positive for arthralgias and back pain.  Neurological:  Negative for dizziness and headaches.        Objective    BP (!) 131/93 (BP Location: Left Arm, Patient Position: Sitting, Cuff Size: Large)   Pulse 67   Ht 5\' 4"  (1.626 m)   Wt 187 lb (84.8 kg)   LMP 12/21/2022   SpO2 100%   BMI 32.10 kg/m    Physical Exam Vitals reviewed.  Constitutional:      Appearance: She is not ill-appearing.  HENT:     Head: Normocephalic.  Eyes:     Conjunctiva/sclera: Conjunctivae normal.  Cardiovascular:     Rate and Rhythm: Normal rate.  Pulmonary:      Effort: Pulmonary effort is normal. No respiratory distress.  Musculoskeletal:     Comments: R knee w/o discoloration, edema. No joint laxity. Limited tenderness to lateral/medial knee on exam.   Pt denies pain with flexion or extension of R knee. Pain with planted feet and twisting.  No lumbar pain with sitting to standing, bending over.  Neurological:     General: No focal deficit present.     Mental Status: She is alert and oriented to person, place, and time.  Psychiatric:        Mood and Affect: Mood normal.        Behavior: Behavior normal.     No results found for any visits on 01/05/23.  Assessment & Plan     1. Acute pain of right knee Given vague injury and persistent pain recommending xray. May be inflammation/strain of musculature supporting knee. Given this and her back pain, recommending steroid taper.   Ok to take alleve, tylenol prn otc. - DG Knee Complete 4 Views Right; Future - methylPREDNISolone (MEDROL DOSEPAK) 4 MG TBPK tablet; Take 6 pills on day 1, 5 pills on day 2, 4 pills on day 3, 3 pills on day 4, 2 pills on day 5, 1 pill on day 6  Dispense: 1 each; Refill: 0  2. Acute midline low back pain with bilateral sciatica Given sudden onset of pain w/ radicular features in an otherwise  health 27 year old, ordering imaging.   Rx steroid taper and recommending nsaids for pain relief.  - DG Lumbar Spine Complete; Future   Advised pt to f/u if steroids do not improve pain  I, Alfredia Ferguson, PA-C have reviewed all documentation for this visit. The documentation on  01/05/2023   for the exam, diagnosis, procedures, and orders are all accurate and complete.  Alfredia Ferguson, PA-C [facility] [address] [place], [place], [address] Office: [phone] Fax: [phone]   [facility]

## 2023-01-05 NOTE — Telephone Encounter (Signed)
  Chief Complaint: R knee pain Symptoms: pain in knee- radiates to foot and hip at times- patient has some numbness and tingling at times, possible swelling Frequency: recent falls- 2 weeks ago Pertinent Negatives: Patient denies no redness Disposition: [] ED /[] Urgent Care (no appt availability in office) / [x] Appointment(In office/virtual)/ []  [place] [facility]/ [] Home Care/ [] Refused Recommended Disposition /[] [place] [facility]/ []  Follow-up with PCP Additional Notes: Offered earlier appointment- but patient is at work and wants later appointment. Advised call back if she should get worse.

## 2023-01-06 ENCOUNTER — Encounter: Payer: Self-pay | Admitting: Physician Assistant

## 2023-01-07 ENCOUNTER — Encounter: Payer: Self-pay | Admitting: Physician Assistant

## 2023-01-14 ENCOUNTER — Other Ambulatory Visit: Payer: Self-pay | Admitting: Physician Assistant

## 2023-01-14 ENCOUNTER — Encounter: Payer: Self-pay | Admitting: Family Medicine

## 2023-01-14 DIAGNOSIS — M25561 Pain in right knee: Secondary | ICD-10-CM

## 2023-01-14 MED ORDER — MELOXICAM 7.5 MG PO TABS: 7.5000 mg | ORAL_TABLET | Freq: Two times a day (BID) | ORAL | 0 refills | Status: AC | PRN

## 2023-01-15 ENCOUNTER — Ambulatory Visit
Admission: RE | Admit: 2023-01-15 | Discharge: 2023-01-15 | Disposition: A | Discharge status: Home / Self Care | Payer: Self-pay | Attending: Physician Assistant | Admitting: Physician Assistant

## 2023-01-15 ENCOUNTER — Ambulatory Visit
Admission: RE | Admit: 2023-01-15 | Discharge: 2023-01-15 | Disposition: A | Discharge status: Home / Self Care | Payer: Self-pay | Source: Ambulatory Visit | Attending: Physician Assistant | Admitting: Physician Assistant

## 2023-01-15 DIAGNOSIS — M5441 Lumbago with sciatica, right side: Secondary | ICD-10-CM | POA: Insufficient documentation

## 2023-01-15 DIAGNOSIS — M5442 Lumbago with sciatica, left side: Secondary | ICD-10-CM | POA: Insufficient documentation

## 2023-01-15 DIAGNOSIS — M25561 Pain in right knee: Secondary | ICD-10-CM

## 2023-02-11 ENCOUNTER — Ambulatory Visit (INDEPENDENT_AMBULATORY_CARE_PROVIDER_SITE_OTHER): Payer: Self-pay | Admitting: Physician Assistant

## 2023-02-11 ENCOUNTER — Encounter: Payer: Self-pay | Admitting: Family Medicine

## 2023-02-11 VITALS — BP 124/81 | HR 79 | Temp 98.3°F | Wt 193.0 lb

## 2023-02-11 DIAGNOSIS — R1084 Generalized abdominal pain: Secondary | ICD-10-CM

## 2023-02-11 LAB — POCT URINALYSIS DIPSTICK
Bilirubin, UA: NEGATIVE
Blood, UA: NEGATIVE
Glucose, UA: NEGATIVE
Ketones, UA: NEGATIVE
Leukocytes, UA: NEGATIVE
Nitrite, UA: NEGATIVE
Protein, UA: NEGATIVE
Spec Grav, UA: 1.015 (ref 1.010–1.025)
Urobilinogen, UA: 0.2 E.U./dL
pH, UA: 6 (ref 5.0–8.0)

## 2023-02-11 MED ORDER — FAMOTIDINE 20 MG PO TABS: 20.0000 mg | ORAL_TABLET | Freq: Two times a day (BID) | ORAL | 0 refills | Status: DC

## 2023-02-11 MED ORDER — OMEPRAZOLE 20 MG PO CPDR: 20.0000 mg | DELAYED_RELEASE_CAPSULE | Freq: Every day | ORAL | 3 refills | Status: AC

## 2023-02-11 NOTE — Progress Notes (Signed)
Established patient visit   Patient: Katrina Bryant   DOB: Dec 14, 1995   26 y.o. Female  MRN: [id number] Visit Date: 02/11/2023  Today's healthcare provider: Debera Lat, PA-C   Chief Complaint  Patient presents with   Acute Visit   Subjective    HPI  Patient is a 27 year old female who presents for evaluation of abdominal tenderness.Patient describes mid abdominal pain that began 4 days ago.  Three days ago it felt to be flank and back pain.   She denies any fever, urinary symptoms or pelvic pain, discharge or bleeding.   She describes it as feeling like trapped gas.  She admits to increased acid reflux, flatulence and burping.  She also admits to eating more fried food this weekend than normal.  Medications: Outpatient Medications Prior to Visit  Medication Sig   meloxicam (MOBIC) 7.5 MG tablet Take 1 tablet (7.5 mg total) by mouth 2 (two) times daily as needed for pain.   methylPREDNISolone (MEDROL DOSEPAK) 4 MG TBPK tablet Take 6 pills on day 1, 5 pills on day 2, 4 pills on day 3, 3 pills on day 4, 2 pills on day 5, 1 pill on day 6   No facility-administered medications prior to visit.    Review of Systems  Constitutional:  Negative for fever.  Gastrointestinal:  Positive for abdominal distention, abdominal pain, diarrhea (Mondfay and Tuesday) and nausea. Negative for blood in stool, constipation and vomiting.  Genitourinary:  Positive for pelvic pain. Negative for difficulty urinating, dyspareunia, dysuria, flank pain, genital sores, hematuria, urgency, vaginal bleeding, vaginal discharge and vaginal pain.       Objective    BP 124/81 (BP Location: Right Arm, Patient Position: Sitting, Cuff Size: Normal)   Pulse 79   Temp 98.3 F (36.8 C) (Oral)   Wt 193 lb (87.5 kg)   SpO2 99%   BMI 33.13 kg/m  Vitals:   02/11/23 1446 02/11/23 1449  BP: (!) 142/100 124/81  Pulse: 79   Temp: 98.3 F (36.8 C)   TempSrc: Oral   SpO2: 99%   Weight: 193 lb (87.5 kg)       Physical Exam HENT:     Nose: Nose normal.  Eyes:     Extraocular Movements: Extraocular movements intact.     Conjunctiva/sclera: Conjunctivae normal.     Pupils: Pupils are equal, round, and reactive to light.  Abdominal:     General: There is distension.     Palpations: There is no mass.     Tenderness: There is abdominal tenderness. There is no right CVA tenderness, left CVA tenderness, guarding or rebound.     Hernia: No hernia is present.  Psychiatric:        Thought Content: Thought content normal.        Judgment: Judgment normal.       Results for orders placed or performed in visit on 02/11/23  POCT urinalysis dipstick  Result Value Ref Range   Color, UA yellow    Clarity, UA clear    Glucose, UA Negative Negative   Bilirubin, UA neg    Ketones, UA neg    Spec Grav, UA 1.015 1.010 - 1.025   Blood, UA neg    pH, UA 6.0 5.0 - 8.0   Protein, UA Negative Negative   Urobilinogen, UA 0.2 0.2 or 1.0 E.U./dL   Nitrite, UA neg    Leukocytes, UA Negative Negative   Appearance     Odor  Assessment & Plan     1. Generalized abdominal pain Acute Mid abdominal, epigastric pain? After fried food Could be due to GERD vs UTI vs back pain Advised to continue with healthy meals to reduce gas and bloating - POCT urinalysis dipstick negative - famotidine (PEPCID) 20 MG tablet; Take 1 tablet (20 mg total) by mouth 2 (two) times daily.  Dispense: 30 tablet; Refill: 0 - omeprazole (PRILOSEC) 20 MG capsule; Take 1 capsule (20 mg total) by mouth daily.  Dispense: 30 capsule; Refill: 3 Will need to be reassessed in 2 weeks by her PCP/  No follow-ups on file.      The patient was advised to call back or seek an in-person evaluation if the symptoms worsen or if the condition fails to improve as anticipated.  I discussed the assessment and treatment plan with the patient. The patient was provided an opportunity to ask questions and all were answered. The patient  agreed with the plan and demonstrated an understanding of the instructions.  I, Debera Lat, PA-C have reviewed all documentation for this visit. The documentation on 02/12/23 for the exam, diagnosis, procedures, and orders are all accurate and complete.  Debera Lat, [facility], MMS [facility] [phone] (phone) [phone] (fax)   [facility]

## 2023-02-12 ENCOUNTER — Ambulatory Visit: Payer: Self-pay

## 2023-02-12 ENCOUNTER — Encounter: Payer: Self-pay | Admitting: Physician Assistant

## 2023-02-12 NOTE — Telephone Encounter (Signed)
  Chief Complaint: medication assistance Symptoms: NA Frequency: today Pertinent Negatives: NA Disposition: [] ED /[] Urgent Care (no appt availability in office) / [] Appointment(In office/virtual)/ []  [place] [facility]/ [] Home Care/ [] Refused Recommended Disposition /[] [place] [facility]/ [x]  Follow-up with PCP Additional Notes: pt states that [place], [place] told her yesterday how to take the Omeprazole and Famotidine specifically but pt can't remember. Pt has sent Mychart message as well that has already been sent to [place], [place] so advised pt I would send message back and see if they can FU with her after lunch. Pt verbalized understanding.   Summary: how to take medication   Pt was calling back to see how to take her medication that was prescribed for her yesterday. Pt states that she forgot how to take the medications. Please advise.     Reason for Disposition  [1] Caller has NON-URGENT medicine question about med that PCP prescribed AND [2] triager unable to answer question  Answer Assessment - Initial Assessment Questions 1. NAME of MEDICINE: "What medicine(s) are you calling about?"     Omeprazole and Famotidine 2. QUESTION: "What is your question?" (e.g., double dose of medicine, side effect)     Forgot how to take both meds 3. PRESCRIBER: "Who prescribed the medicine?" Reason: if prescribed by specialist, call should be referred to that group.     Edmon Crape, PA 4. SYMPTOMS: "Do you have any symptoms?" If Yes, ask: "What symptoms are you having?"  "How bad are the symptoms (e.g., mild, moderate, severe)     NA  Protocols used: Medication Question Call-A-AH

## 2023-02-13 ENCOUNTER — Encounter: Payer: Self-pay | Admitting: Physician Assistant

## 2023-02-15 ENCOUNTER — Ambulatory Visit (INDEPENDENT_AMBULATORY_CARE_PROVIDER_SITE_OTHER): Payer: Self-pay | Admitting: Physician Assistant

## 2023-02-15 VITALS — BP 121/81 | HR 77 | Temp 97.2°F | Wt 195.0 lb

## 2023-02-15 DIAGNOSIS — K2100 Gastro-esophageal reflux disease with esophagitis, without bleeding: Secondary | ICD-10-CM

## 2023-02-15 DIAGNOSIS — K582 Mixed irritable bowel syndrome: Secondary | ICD-10-CM

## 2023-02-15 DIAGNOSIS — F32A Depression, unspecified: Secondary | ICD-10-CM

## 2023-02-15 DIAGNOSIS — F419 Anxiety disorder, unspecified: Secondary | ICD-10-CM

## 2023-02-15 NOTE — Progress Notes (Signed)
  Vivien Rota DeSanto,acting as a Neurosurgeon for [employer], PA-C.,have documented all relevant documentation on the behalf of Debera Lat, PA-C,as directed by  [employer], PA-C while in the presence of [employer], PA-C.   Vivien Rota DeSanto,acting as a Neurosurgeon for [employer], PA-C.,have documented all relevant documentation on the behalf of Debera Lat, PA-C,as directed by  [employer], PA-C while in the presence of [employer], PA-C.     Established patient visit   Patient: Katrina Bryant   DOB: June 17, 1996   26 y.o. Female  MRN: [id number] Visit Date: 02/15/2023  Today's healthcare provider: Debera Lat, PA-C   No chief complaint on file.  Subjective    HPI  Patient is a 27 year old female who presents for follow up of abdominal pain.  She was last seen on 02/11/23.  Patient was given prescription for Pepcid and Omeprazole.  She was confused on how to take the medications.  She took the Pepcid the first day and then took the Omeprazole for the next 3 days including today.   She reports feeling slightly better,  However she did report having 4 bowel movements today that were black in color.   She denies seeing any blood.    10/02/2022   10:15 AM 04/08/2022    3:37 PM 02/24/2022    3:03 PM  PHQ9 SCORE ONLY  PHQ-9 Total Score 14 18 20     Medications: Outpatient Medications Prior to Visit  Medication Sig   meloxicam (MOBIC) 7.5 MG tablet Take 1 tablet (7.5 mg total) by mouth 2 (two) times daily as needed for pain.   omeprazole (PRILOSEC) 20 MG capsule Take 1 capsule (20 mg total) by mouth daily.   [DISCONTINUED] methylPREDNISolone (MEDROL DOSEPAK) 4 MG TBPK tablet Take 6 pills on day 1, 5 pills on day 2, 4 pills on day 3, 3 pills on day 4, 2 pills on day 5, 1 pill on day 6   famotidine (PEPCID) 20 MG tablet Take 1 tablet (20 mg total) by mouth 2 (two) times daily. (Patient not taking: Reported on 02/15/2023)   No facility-administered medications prior to visit.     Review of Systems  {Labs  Heme  Chem  Endocrine  Serology  Results Review (optional):23779}   Objective    BP 121/81 (BP Location: Right Arm, Patient Position: Sitting, Cuff Size: Normal)   Pulse 77   Temp (!) 97.2 F (36.2 C) (Oral)   Wt 195 lb (88.5 kg)   SpO2 100%   BMI 33.47 kg/m  Vitals:   02/15/23 1358 02/15/23 1401  BP: 124/89 121/81  Pulse: 77   Temp: (!) 97.2 F (36.2 C)   TempSrc: Oral   SpO2: 100%   Weight: 195 lb (88.5 kg)      Physical Exam  ***  No results found for any visits on 02/15/23.  Assessment & Plan     ***  No follow-ups on file.      {provider attestation***:1}   Debera Lat, PA-C  [facility] [phone] (phone) [phone] (fax)  [facility]

## 2023-02-17 ENCOUNTER — Encounter: Payer: Self-pay | Admitting: Physician Assistant

## 2023-03-09 ENCOUNTER — Other Ambulatory Visit: Payer: Self-pay | Admitting: Physician Assistant

## 2023-03-09 DIAGNOSIS — R1084 Generalized abdominal pain: Secondary | ICD-10-CM

## 2023-03-09 NOTE — Telephone Encounter (Signed)
Requested medication (s) are due for refill today - yes-if to continue  Requested medication (s) are on the active medication list -yes  Future visit scheduled -yes  Last refill: 02/11/23 #30  Notes to clinic: Rx written for acute visit- does have f/u 03/19/23- sent for review to continue  Requested Prescriptions  Pending Prescriptions Disp Refills   famotidine (PEPCID) 20 MG tablet [Pharmacy Med Name: FAMOTIDINE 20MG  TABLETS] 30 tablet 0    Sig: TAKE 1 TABLET(20 MG) BY MOUTH TWICE DAILY     Gastroenterology:  H2 Antagonists Passed - 03/09/2023  8:16 AM      Passed - Valid encounter within last 12 months    Recent Outpatient Visits           3 weeks ago Gastroesophageal reflux disease with esophagitis without hemorrhage   [facility] [place], [place], PA-C   3 weeks ago Generalized abdominal pain   [place] [facility] [place], [place], PA-C   2 months ago Acute pain of right knee   [facility] Alfredia Ferguson, PA-C   5 months ago Palpitations   [place] [facility] [place], Marzella Schlein, MD   11 months ago Left upper quadrant abdominal pain   [place] [facility] Caro Laroche, DO       Future Appointments             In 1 week Debera Lat, PA-C [place] [facility], [facility]               Requested Prescriptions  Pending Prescriptions Disp Refills   famotidine (PEPCID) 20 MG tablet [Pharmacy Med Name: FAMOTIDINE 20MG  TABLETS] 30 tablet 0    Sig: TAKE 1 TABLET(20 MG) BY MOUTH TWICE DAILY     Gastroenterology:  H2 Antagonists Passed - 03/09/2023  8:16 AM      Passed - Valid encounter within last 12 months    Recent Outpatient Visits           3 weeks ago Gastroesophageal reflux disease with esophagitis without hemorrhage   [place] [facility] [place], [place], PA-C   3 weeks ago Generalized abdominal pain   [place]  [facility] [place], [place], PA-C   2 months ago Acute pain of right knee   [facility] Alfredia Ferguson, PA-C   5 months ago Palpitations   [place] [facility] [place], Marzella Schlein, MD   11 months ago Left upper quadrant abdominal pain   [facility] Caro Laroche, DO       Future Appointments             In 1 week Debera Lat, PA-C [facility]

## 2023-03-12 ENCOUNTER — Ambulatory Visit: Payer: Self-pay | Admitting: Physician Assistant

## 2023-03-14 NOTE — Progress Notes (Deleted)
Established patient visit  Patient: Katrina Bryant   DOB: 20-Apr-1996   26 y.o. Female  MRN: [id number] Visit Date: 03/19/2023  Today's healthcare provider: Debera Lat, PA-C   No chief complaint on file.  Subjective    HPI  *** Discussed the use of AI scribe software for clinical note transcription with the patient, who gave verbal consent to proceed.  History of Present Illness               10/02/2022   10:15 AM 04/08/2022    3:37 PM 02/24/2022    3:03 PM  Depression screen PHQ 2/9  Decreased Interest 1 2 2   Down, Depressed, Hopeless 2 3 3   PHQ - 2 Score 3 5 5   Altered sleeping 0 2 2  Tired, decreased energy 2 2 2   Change in appetite 3 2 2   Feeling bad or failure about yourself  2 3 3   Trouble concentrating 1 2 2   Moving slowly or fidgety/restless 2 1 1   Suicidal thoughts 1 1 3   PHQ-9 Score 14 18 20   Difficult doing work/chores Very difficult Extremely dIfficult Very difficult      10/02/2022   10:16 AM 02/24/2022    3:03 PM 07/21/2019    3:34 PM 04/12/2019    4:21 PM  GAD 7 : Generalized Anxiety Score  Nervous, Anxious, on Edge 1 3 3 3   Control/stop worrying 3 3 3 3   Worry too much - different things 3 3 3 3   Trouble relaxing 2 1 2 3   Restless 1 1 1 3   Easily annoyed or irritable 1 3 1 3   Afraid - awful might happen 1 1 1 3   Total GAD 7 Score 12 15 14 21   Anxiety Difficulty Very difficult Very difficult Very difficult Extremely difficult    Medications: Outpatient Medications Prior to Visit  Medication Sig  . famotidine (PEPCID) 20 MG tablet TAKE 1 TABLET(20 MG) BY MOUTH TWICE DAILY  . meloxicam (MOBIC) 7.5 MG tablet Take 1 tablet (7.5 mg total) by mouth 2 (two) times daily as needed for pain.  [place] omeprazole (PRILOSEC) 20 MG capsule Take 1 capsule (20 mg total) by mouth daily.   No facility-administered medications prior to visit.    Review of Systems  All other systems reviewed and are negative. Except see HPI   {Insert previous labs  (optional):23779}  {See past labs  Heme  Chem  Endocrine  Serology  Results Review (optional):1}   Objective    There were no vitals taken for this visit. {Insert last BP/Wt (optional):23777}  {See vitals history (optional):1}  Physical Exam Vitals reviewed.  Constitutional:      General: She is not in acute distress.    Appearance: Normal appearance. She is well-developed. She is not diaphoretic.  HENT:     Head: Normocephalic and atraumatic.  Eyes:     General: No scleral icterus.    Conjunctiva/sclera: Conjunctivae normal.  Neck:     Thyroid: No thyromegaly.  Cardiovascular:     Rate and Rhythm: Normal rate and regular rhythm.     Pulses: Normal pulses.     Heart sounds: Normal heart sounds. No murmur heard. Pulmonary:     Effort: Pulmonary effort is normal. No respiratory distress.     Breath sounds: Normal breath sounds. No wheezing, rhonchi or rales.  Musculoskeletal:     Cervical back: Neck supple.     Right lower leg: No edema.     Left lower leg: No edema.  Lymphadenopathy:     Cervical: No cervical adenopathy.  Skin:    General: Skin is warm and dry.     Findings: No rash.  Neurological:     Mental Status: She is alert and oriented to person, place, and time. Mental status is at baseline.  Psychiatric:        Mood and Affect: Mood normal.        Behavior: Behavior normal.     No results found for any visits on 03/19/23.  Assessment & Plan    *** Assessment and Plan              No follow-ups on file.      [facility]

## 2023-03-19 ENCOUNTER — Ambulatory Visit: Payer: Self-pay | Admitting: Physician Assistant

## 2023-03-26 ENCOUNTER — Ambulatory Visit: Payer: Self-pay | Admitting: Physician Assistant

## 2023-04-26 ENCOUNTER — Ambulatory Visit
Admission: EM | Admit: 2023-04-26 | Discharge: 2023-04-26 | Disposition: A | Discharge status: Home / Self Care | Payer: Self-pay | Attending: Physician Assistant | Admitting: Physician Assistant

## 2023-04-26 DIAGNOSIS — J069 Acute upper respiratory infection, unspecified: Secondary | ICD-10-CM | POA: Insufficient documentation

## 2023-04-26 DIAGNOSIS — R051 Acute cough: Secondary | ICD-10-CM | POA: Insufficient documentation

## 2023-04-26 DIAGNOSIS — J029 Acute pharyngitis, unspecified: Secondary | ICD-10-CM | POA: Insufficient documentation

## 2023-04-26 LAB — GROUP A STREP BY PCR: Group A Strep by PCR: NOT DETECTED

## 2023-04-26 MED ORDER — IPRATROPIUM BROMIDE 0.06 % NA SOLN: 2.0000 | Freq: Four times a day (QID) | NASAL | 0 refills | Status: AC

## 2023-04-26 MED ORDER — LIDOCAINE VISCOUS HCL 2 % MT SOLN: 15.0000 mL | OROMUCOSAL | 0 refills | Status: AC | PRN

## 2023-04-26 MED ORDER — PROMETHAZINE-DM 6.25-15 MG/5ML PO SYRP: 5.0000 mL | ORAL_SOLUTION | Freq: Four times a day (QID) | ORAL | 0 refills | Status: AC | PRN

## 2023-04-26 NOTE — ED Provider Notes (Signed)
[facility]    CSN: [id number] Arrival date & time: 04/26/23  1005      History   Chief Complaint Chief Complaint  Patient presents with   Sore Throat   Nasal Congestion   Cough   Otalgia    HPI Bristal Hooter is a 27 y.o. female presenting for 6-day history of fatigue, sore throat, bilateral ear pain, cough, congestion, sinus pressure.  Denies chest pain or breathing difficulty, vomiting or diarrhea.  Reports her partner has been sick as well.  She also states she has been around sick coworkers.  Has been taking OTC DayQuil and TheraFlu but it has not really been helping and she feels that her sore throat and symptoms have gotten worse.  HPI  Past Medical History:  Diagnosis Date   Allergy    Anxiety    Breast pain    Colitis    Depression    Diffuse abdominal pain 04/22/2021   Irregular menstrual cycle    Nausea and vomiting 04/22/2021   Pelvic pain     Patient Active Problem List   Diagnosis Date Noted   Palpitations 10/02/2022   Onychomycosis 02/05/2022   Moderate episode of recurrent major depressive disorder (HCC) 02/05/2022   Left upper quadrant abdominal pain 01/28/2022   Gastroesophageal reflux disease with esophagitis without hemorrhage 01/28/2022   Obesity (BMI 30.0-34.9) 01/28/2022   Non-seasonal allergic rhinitis due to pollen 01/28/2022   Elevated LFTs 05/24/2018   Breast pain, left 04/13/2018    Past Surgical History:  Procedure Laterality Date   BREAST BIOPSY Right 03/08/2018   Affirm Bx-  X clip INTRADUCTAL PAPILLOMA.    COLONOSCOPY  03/02/2018    OB History     Gravida  0   Para  0   Term  0   Preterm  0   AB  0   Living  0      SAB  0   IAB  0   Ectopic  0   Multiple  0   Live Births  0        Obstetric Comments  Menstrual age: 26            Home Medications    Prior to Admission medications   Medication Sig Start Date End Date Taking? Authorizing Provider  famotidine (PEPCID) 20 MG  tablet TAKE 1 TABLET(20 MG) BY MOUTH TWICE DAILY 03/09/23  Yes Bacigalupo, Marzella Schlein, MD  ipratropium (ATROVENT) 0.06 % nasal spray Place 2 sprays into both nostrils 4 (four) times daily. 04/26/23  Yes Eusebio Friendly B, PA-C  lidocaine (XYLOCAINE) 2 % solution Use as directed 15 mLs in the mouth or throat every 3 (three) hours as needed for mouth pain (swish and spit). 04/26/23  Yes Shirlee Latch, PA-C  meloxicam (MOBIC) 7.5 MG tablet Take 1 tablet (7.5 mg total) by mouth 2 (two) times daily as needed for pain. 01/14/23  Yes Drubel, Lillia Abed, PA-C  omeprazole (PRILOSEC) 20 MG capsule Take 1 capsule (20 mg total) by mouth daily. 02/11/23  Yes Ostwalt, Edmon Crape, PA-C  promethazine-dextromethorphan (PROMETHAZINE-DM) 6.25-15 MG/5ML syrup Take 5 mLs by mouth 4 (four) times daily as needed. 04/26/23  Yes Shirlee Latch, PA-C    Family History Family History  Problem Relation Age of Onset   Diabetes Father    Hyperlipidemia Father    Hypertension Father    Hypertension Mother    Healthy Sister    Healthy Brother    Breast cancer Neg Hx  Ovarian cancer Neg Hx    Colon cancer Neg Hx    Cervical cancer Neg Hx     Social History Social History   Tobacco Use   Smoking status: Never   Smokeless tobacco: Never  Vaping Use   Vaping status: Some Days  Substance Use Topics   Alcohol use: Never   Drug use: Never     Allergies   Amoxicillin, Bactrim [sulfamethoxazole-trimethoprim], Biaxin [clarithromycin], Cephalosporins, Clindamycin/lincomycin, and Penicillins   Review of Systems Review of Systems  Constitutional:  Positive for fatigue. Negative for chills, diaphoresis and fever.  HENT:  Positive for congestion, ear pain, rhinorrhea, sneezing and sore throat. Negative for sinus pressure and sinus pain.   Respiratory:  Positive for cough. Negative for shortness of breath.   Cardiovascular:  Negative for chest pain.  Gastrointestinal:  Negative for abdominal pain, nausea and vomiting.   Musculoskeletal:  Negative for arthralgias and myalgias.  Skin:  Negative for rash.  Neurological:  Negative for weakness and headaches.  Hematological:  Negative for adenopathy.     Physical Exam Triage Vital Signs ED Triage Vitals  Encounter Vitals Group     BP      Systolic BP Percentile      Diastolic BP Percentile      Pulse      Resp      Temp      Temp src      SpO2      Weight      Height      Head Circumference      Peak Flow      Pain Score      Pain Loc      Pain Education      Exclude from Growth Chart    No data found.  Updated Vital Signs BP 129/84 (BP Location: Right Arm)   Pulse 73   Temp 99.4 F (37.4 C) (Oral)   Resp 16   Ht 5\' 4"  (1.626 m)   Wt 190 lb (86.2 kg)   SpO2 97%   BMI 32.61 kg/m     Physical Exam Vitals and nursing note reviewed.  Constitutional:      General: She is not in acute distress.    Appearance: Normal appearance. She is not ill-appearing or toxic-appearing.  HENT:     Head: Normocephalic and atraumatic.     Right Ear: Ear canal and external ear normal. A middle ear effusion is present.     Left Ear: Ear canal and external ear normal. A middle ear effusion is present.     Nose: Congestion present.     Mouth/Throat:     Mouth: Mucous membranes are moist.     Pharynx: Oropharynx is clear. Posterior oropharyngeal erythema present.  Eyes:     General: No scleral icterus.       Right eye: No discharge.        Left eye: No discharge.     Conjunctiva/sclera: Conjunctivae normal.  Cardiovascular:     Rate and Rhythm: Normal rate and regular rhythm.     Heart sounds: Normal heart sounds.  Pulmonary:     Effort: Pulmonary effort is normal. No respiratory distress.     Breath sounds: Normal breath sounds.  Musculoskeletal:     Cervical back: Neck supple.  Skin:    General: Skin is dry.  Neurological:     General: No focal deficit present.     Mental Status: She is alert. Mental status is at  baseline.     Motor: No  weakness.     Gait: Gait normal.  Psychiatric:        Mood and Affect: Mood normal.        Behavior: Behavior normal.        Thought Content: Thought content normal.      UC Treatments / Results  Labs (all labs ordered are listed, but only abnormal results are displayed) Labs Reviewed  GROUP A STREP BY PCR    EKG   Radiology No results found.  Procedures Procedures (including critical care time)  Medications Ordered in UC Medications - No data to display  Initial Impression / Assessment and Plan / UC Course  I have reviewed the triage vital signs and the nursing notes.  Pertinent labs & imaging results that were available during my care of the patient were reviewed by me and considered in my medical decision making (see chart for details).   27 year old female presents for 6-day history of nasal congestion, sore throat, postnasal drainage, cough.  Vitals normal and stable and she is overall well-appearing.  Clear effusion to bilateral TMs, nasal congestion, mild posterior pharyngeal erythema and chest clear.  Strep test obtained..  Viral URI.  Supportive care encouraged.  Sent viscous lidocaine, Atrovent nasal spray and Promethazine DM to pharmacy.  Reviewed return precautions.  Work note given.   Final Clinical Impressions(s) / UC Diagnoses   Final diagnoses:  Viral upper respiratory tract infection  Acute cough  Sore throat     Discharge Instructions      URI/COLD SYMPTOMS: Your exam today is consistent with a viral illness. Antibiotics are not indicated at this time. Use medications as directed, including cough syrup, nasal saline, and decongestants. Your symptoms should improve over the next few days and resolve within 7-10 days. Increase rest and fluids. F/u if symptoms worsen or predominate such as sore throat, ear pain, productive cough, shortness of breath, or if you develop high fevers or worsening fatigue over the next several days.       ED  Prescriptions     Medication Sig Dispense Auth. Provider   lidocaine (XYLOCAINE) 2 % solution Use as directed 15 mLs in the mouth or throat every 3 (three) hours as needed for mouth pain (swish and spit). 100 mL Eusebio Friendly B, PA-C   ipratropium (ATROVENT) 0.06 % nasal spray Place 2 sprays into both nostrils 4 (four) times daily. 15 mL Eusebio Friendly B, PA-C   promethazine-dextromethorphan (PROMETHAZINE-DM) 6.25-15 MG/5ML syrup Take 5 mLs by mouth 4 (four) times daily as needed. 118 mL Shirlee Latch, PA-C      PDMP not reviewed this encounter.   Shirlee Latch, PA-C 04/26/23 1141

## 2023-04-26 NOTE — ED Triage Notes (Signed)
Pt c/o sore throat,congestion,cough & bilateral ear pain x6 days. Denies any fevers. Has tried dayquil & theraflu w/o relief.

## 2023-04-26 NOTE — Discharge Instructions (Signed)

## 2023-12-07 ENCOUNTER — Encounter: Payer: Self-pay | Admitting: Family Medicine

## 2024-07-14 ENCOUNTER — Ambulatory Visit: Payer: Self-pay

## 2024-07-14 NOTE — Telephone Encounter (Signed)
 FYI Only or Action Required?: FYI only for provider: ED advised.  Patient was last seen in primary care on 02/15/2023 by Ostwalt, Janna, PA-C.  Called Nurse Triage reporting Photophobia.  Symptoms began several days ago.  Triage Disposition: Go to ED Now (or PCP Triage)  Patient/caregiver understands and will follow disposition?: Yes          Copied from CRM #[id number]. Topic: Clinical - Red Word Triage >> Jul 14, 2024 10:54 AM Katrina Bryant wrote: Red Word that prompted transfer to Nurse Triage: Hit head Monday evening, having head pain and sensitivity to light, pain level 8-10 Reason for Disposition  Headache  Answer Assessment - Initial Assessment Questions This RN recommends pt goes to ED and has another adult drive pt there. Pt agreeable.   Pt hit head Mon evening  Pt was taking something out of freezer and hit head on top door of fridge; hit head towards back top of head; felt forehead pain even though pt didn't hit head there  Pt was fine on Tues; symptoms started on Wed  Headache (was the worst on Wed; now intermittent and worsens if pt stares at light or goes outside); pain is mainly on right side of head, sharp pain 8/10 intermittent  Photosensitivity- Sharp pain if pt stares at light, started Wed When pt looked up/down on Wed pt felt dizzy; no dizziness now  Protocols used: Neurologic Deficit-A-AH

## 2024-07-19 ENCOUNTER — Encounter: Payer: Self-pay | Admitting: Family Medicine

## 2024-07-31 ENCOUNTER — Encounter: Payer: Self-pay | Admitting: Family Medicine

## 2024-07-31 ENCOUNTER — Ambulatory Visit: Payer: Self-pay | Admitting: Family Medicine

## 2024-07-31 VITALS — BP 125/69 | HR 65 | Ht 64.0 in | Wt 197.2 lb

## 2024-07-31 DIAGNOSIS — E162 Hypoglycemia, unspecified: Secondary | ICD-10-CM

## 2024-07-31 DIAGNOSIS — Z23 Encounter for immunization: Secondary | ICD-10-CM

## 2024-07-31 DIAGNOSIS — Z833 Family history of diabetes mellitus: Secondary | ICD-10-CM

## 2024-07-31 DIAGNOSIS — F0781 Postconcussional syndrome: Secondary | ICD-10-CM

## 2024-07-31 NOTE — Progress Notes (Signed)
 Established patient visit   Patient: Katrina Bryant   DOB: 1995/11/01   28 y.o. Female  MRN: [id number] Visit Date: 07/31/2024  Today's healthcare provider: Jon Eva, MD   Chief Complaint  Patient presents with   Follow-up    2 week Follow up after concussion and visit to [facility] ED on 07/14/24 where neurological exam and CT scan were completed.    Concussion    Light sensitivity has improved, headaches still coming and going but not as intense, along with dizziness.    Subjective    HPI HPI     Follow-up    Additional comments: 2 week Follow up after concussion and visit to [facility] ED on 07/14/24 where neurological exam and CT scan were completed.         Concussion    Additional comments: Light sensitivity has improved, headaches still coming and going but not as intense, along with dizziness.       Last edited by Lilian Fitzpatrick, CMA on 07/31/2024 10:07 AM.       Discussed the use of AI scribe software for clinical note transcription with the patient, who gave verbal consent to proceed.  History of Present Illness   Katrina Bryant is a 28 year old female who presents with post-concussion symptoms.  Two weeks ago she hit her head on a freezer door and developed symptoms a few days later. She has intermittent headaches that are now less severe and less frequent, with improved light sensitivity. She has occasional dizziness, especially when standing quickly, with a sensation she might fall. Symptoms worsen with increased activity and prolonged screen time. A CT head at the ER after the injury was reported as normal.  In the last couple of weeks she has had more frequent episodes of shakiness, sweating, and feeling faint if she does not eat a substantial meal. She is concerned about her glucose levels given a family history of diabetes.         Medications: Outpatient Medications Prior to Visit  Medication Sig   famotidine   (PEPCID ) 20 MG tablet TAKE 1 TABLET(20 MG) BY MOUTH TWICE DAILY   ipratropium (ATROVENT ) 0.06 % nasal spray Place 2 sprays into both nostrils 4 (four) times daily.   lidocaine  (XYLOCAINE ) 2 % solution Use as directed 15 mLs in the mouth or throat every 3 (three) hours as needed for mouth pain (swish and spit).   meloxicam  (MOBIC ) 7.5 MG tablet Take 1 tablet (7.5 mg total) by mouth 2 (two) times daily as needed for pain.   omeprazole  (PRILOSEC) 20 MG capsule Take 1 capsule (20 mg total) by mouth daily.   promethazine -dextromethorphan (PROMETHAZINE -DM) 6.25-15 MG/5ML syrup Take 5 mLs by mouth 4 (four) times daily as needed.   No facility-administered medications prior to visit.    Review of Systems     Objective    BP 125/69 (BP Location: Left Arm, Patient Position: Sitting, Cuff Size: Normal)   Pulse 65   Ht 5' 4 (1.626 m)   Wt 197 lb 3.2 oz (89.4 kg)   SpO2 100%   BMI 33.85 kg/m    Physical Exam Vitals reviewed.  Constitutional:      General: She is not in acute distress.    Appearance: Normal appearance. She is well-developed. She is not diaphoretic.  HENT:     Head: Normocephalic and atraumatic.  Eyes:     General: No scleral icterus.    Conjunctiva/sclera: Conjunctivae normal.  Neck:  Thyroid : No thyromegaly.  Cardiovascular:     Rate and Rhythm: Normal rate and regular rhythm.     Heart sounds: Normal heart sounds. No murmur heard. Pulmonary:     Effort: Pulmonary effort is normal. No respiratory distress.     Breath sounds: Normal breath sounds. No wheezing, rhonchi or rales.  Musculoskeletal:     Cervical back: Neck supple.     Right lower leg: No edema.     Left lower leg: No edema.  Skin:    General: Skin is warm and dry.     Findings: No rash.  Neurological:     Mental Status: She is alert and oriented to person, place, and time. Mental status is at baseline.     Cranial Nerves: No cranial nerve deficit.     Sensory: No sensory deficit.     Motor: No  weakness.     Coordination: Coordination normal.     Gait: Gait normal.  Psychiatric:        Mood and Affect: Mood normal.        Behavior: Behavior normal.      No results found for any visits on 07/31/24.  Assessment & Plan     Problem List Items Addressed This Visit   None Visit Diagnoses       Post concussion syndrome    -  Primary     Immunization due       Relevant Orders   Flu vaccine trivalent PF, 6mos and older(Flulaval,Afluria,Fluarix,Fluzone)     Hypoglycemia       Relevant Orders   Insulin  and C-Peptide   Hemoglobin A1c     Family history of diabetes mellitus (DM)       Relevant Orders   Insulin  and C-Peptide   Hemoglobin A1c           Postconcussion syndrome Following a minor head injury. Symptoms include intermittent headaches, resolved light sensitivity, and occasional dizziness. Symptoms are improving but not fully resolved. No daily headaches, so nortriptyline is not indicated. Occasional dizziness likely related to concussion recovery. Emphasis on hydration and reducing eye strain to aid recovery. - Continue to monitor symptoms and allow time for further recovery. - Ensure adequate hydration. - Take breaks from screen time to reduce eye strain. - Avoid further head trauma. - Will consider referral to sports medicine if symptoms persist beyond a couple of weeks.  Hypoglycemia and risk for diabetes mellitus Intermittent symptoms of hypoglycemia, including shakiness and sweating, particularly when meals are delayed. Family history of diabetes raises concern for potential insulin  resistance or early diabetes. Symptoms may be related to insulin  resistance rather than diabetes. - Ordered A1c and insulin  level tests to assess for diabetes and insulin  resistance. - Will review lab results and communicate findings via MyChart.  General health maintenance Discussion of flu vaccination as part of routine health maintenance. - Administered flu shot today.         Return if symptoms worsen or fail to improve.       Jon Eva, MD  [facility] [phone] (phone) [phone] (fax)  [facility]

## 2024-08-01 ENCOUNTER — Ambulatory Visit: Payer: Self-pay | Admitting: Family Medicine

## 2024-08-01 LAB — INSULIN AND C-PEPTIDE, SERUM
C-Peptide: 4 ng/mL (ref 1.1–4.4)
INSULIN: 32.5 u[IU]/mL — ABNORMAL HIGH (ref 2.6–24.9)

## 2024-08-01 LAB — HEMOGLOBIN A1C
Est. average glucose Bld gHb Est-mCnc: 105 mg/dL
Hgb A1c MFr Bld: 5.3 % (ref 4.8–5.6)
